# Patient Record
Sex: Male | Born: 1970 | Race: White | Hispanic: No | Marital: Single | Smoking: Current every day smoker
Health system: Southern US, Community
[De-identification: ages and names within clinical notes are randomized; demographics above are authoritative.]

## PROBLEM LIST (undated history)

## (undated) DIAGNOSIS — E119 Type 2 diabetes mellitus without complications: Secondary | ICD-10-CM

## (undated) DIAGNOSIS — S37019A Minor contusion of unspecified kidney, initial encounter: Secondary | ICD-10-CM

## (undated) DIAGNOSIS — N200 Calculus of kidney: Secondary | ICD-10-CM

---

## 2004-04-05 ENCOUNTER — Emergency Department: Payer: Self-pay | Admitting: Emergency Medicine

## 2005-07-30 ENCOUNTER — Observation Stay (HOSPITAL_COMMUNITY): Admission: EM | Admit: 2005-07-30 | Discharge: 2005-07-31 | Payer: Self-pay | Admitting: Emergency Medicine

## 2005-07-30 ENCOUNTER — Ambulatory Visit: Payer: Self-pay | Admitting: Cardiology

## 2006-08-06 ENCOUNTER — Ambulatory Visit (HOSPITAL_COMMUNITY): Admission: RE | Admit: 2006-08-06 | Discharge: 2006-08-06 | Payer: Self-pay | Admitting: Family Medicine

## 2007-02-27 ENCOUNTER — Ambulatory Visit (HOSPITAL_COMMUNITY): Admission: RE | Admit: 2007-02-27 | Discharge: 2007-02-27 | Payer: Self-pay | Admitting: Internal Medicine

## 2010-05-26 NOTE — Consult Note (Signed)
NAME:  Allen Mooney, Allen Mooney                  ACCOUNT NO.:  [id number]   MEDICAL RECORD NO.:  [id number]          PATIENT TYPE:  INP   LOCATION:  A227                          FACILITY:  [facility]   PHYSICIAN:  Weinert Bing, M.D. [facility] OF BIRTH:  08-Aug-1970   DATE OF CONSULTATION:  DATE OF DISCHARGE:                                   CONSULTATION   REFERRING PHYSICIAN:  Hanley Hays. Josefine Class, M.D.   PRIMARY CARE PHYSICIAN:  [facility] Department.   HISTORY OF PRESENT ILLNESS:  A 40 year old gentleman with no prior cardiac  history presents with chest discomfort.  Allen Mooney has enjoyed generally good  health.  He has never been admitted to hospital.  He was found to have  diabetes a few years ago and has received intermittent and minimal on a  treatment since that time.  He has had a number emergency department visits  for dizziness and malaise attributed to hyperglycemia.  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  There was associated  diaphoresis, nausea and dyspnea.  EMS was summoned and administered 2  sublingual nitroglycerin with improvement.  After additional nitroglycerin  in the emergency department, symptoms gradually subsided.  The patient has  felt fine since.   Cardiovascular risk factors include a 15-pack-year history of cigarette  smoking, which continues at the rate of one pack per day.  There is no  history of hypertension.  Cholesterol has been checked in the past and has  been okay.  There is no family history for coronary disease.   PAST MEDICAL HISTORY:  Is otherwise benign.  He has never undergone any  surgical procedures.  He has never previously been evaluated by cardiologist  nor required any significant cardiac testing.   SOCIAL HISTORY:  Single, but lives with a girlfriend who has two teenage  children.  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  No excessive use of alcohol.  Denies other drugs.   He has no known medical allergies.   MEDICATIONS AT HOME:  Metformin once a day.   FAMILY HISTORY:  Multiple members with diabetes; no coronary disease.   REVIEW OF SYSTEMS:  Chronic and recurrent headaches, some characterized as  migraines.  He has sleep disturbance, both with difficulty falling asleep  and multiple middle of the night awakenings.  He claims to sleep only 1-2  hours per night.  This results in daytime fatigue and somnolence, but he  does not fall asleep spontaneously during the day.  He reports no history of  GI illnesses.  He has had no lung conditions.  There is no chronic cough nor  sputum production.  He has not had multiple episodes of bronchitis.  He has  never suffered a severe injury nor required the services of an orthopedic  surgeon.  All other systems reviewed and are negative.   PHYSICAL EXAMINATION:  GENERAL:  A pleasant young gentleman in no acute  distress.  VITAL SIGNS:  The blood pressure is 120/80, heart rate 80 and regular,  respirations 16.  NECK:  No jugular venous distension; normal carotid upstrokes without  bruits.  HEENT:  Pupils equal, round, react to light; EOMs full; anicteric sclerae.  SKIN:  No significant abnormalities.  ENDOCRINE:  No thyromegaly.  HEMATOPOIETIC:  No adenopathy.  LUNGS:  Clear.  CARDIAC:  Normal first and second heart sounds.  ABDOMEN:  Soft and nontender; no masses; no organomegaly; no bruits.  EXTREMITIES:  No edema; normal distal pulse pulses.  NEUROMUSCULAR:  Symmetric strength and tone; normal cranial nerves.  MUSCULOSKELETAL:  No joint deformities.Marland Kitchen  EXTREMITIES:  Few minor varicose veins.   Initial laboratory notable for normal chemistry profile except for glucose  of 357, normal point-of-care markers, normal CBC.  A drug screen was  negative.   Chest x-ray is reportedly negative.   EKG shows normal sinus rhythm and  is within normal limits.   IMPRESSION:  Allen Mooney is a young gentleman who has a relatively low risk of  coronary disease despite the presence of diabetes for approximately 5 years.  Nonetheless, his episode of chest discomfort was consistent with myocardial  ischemia based upon the quality of discomfort and associated symptoms.  Reassuring is a normal exam, normal EKG and normal cardiac markers.  We will  proceed with a standard stress test in the morning if markers and EKGs  remain normal.  Other diagnostic considerations include pulmonary embolism  (a D-dimer level will be obtained), a paroxysmal arrhythmia or discomfort of  GI origin.  We greatly appreciate the request for consultation and will be  happy to follow this nice gentleman with you.      [place] Bing, M.D. [facility]  Electronically Signed     RR/MEDQ  D:  07/30/2005  T:  07/30/2005  Job:  [id number]

## 2010-05-26 NOTE — Procedures (Signed)
NAME:  Allen Mooney, Allen Mooney                  ACCOUNT NO.:  [id number]   MEDICAL RECORD NO.:  [id number]          PATIENT TYPE:  INP   LOCATION:  A227                          FACILITY:  [facility]   PHYSICIAN:  Vida Roller, M.D.   DATE OF BIRTH:  14-Feb-1970   DATE OF PROCEDURE:  07/31/2005  DATE OF DISCHARGE:                                  ECHOCARDIOGRAM   PRIMARY:  Dr. Othelia Pulling NUMBER:  [id number]   TAPE COUNT:  [id number]   A 40 year old man with chest pain.  Technical quality of the study is  adequate.   M-MODE TRACINGS:  The aorta is 34 mm.   Left atrium is 34 mm.   Septum is 10 mm.   Posterior wall is 10 mm.   Left ventricular diastolic dimension is 45 mm.   Left ventricular systolic dimension 32 mm.   2-D AND DOPPLER IMAGING:  The left ventricle is normal size.  There is  preserved LV systolic function.  Estimated ejection fraction 50-55%.  No  wall motion abnormalities are seen.   Right ventricle is top normal in size with normal systolic function.   The atria are both normal size.   There is no significant valvular heart disease.   There is no pericardial effusion.   The IVC is normal size.      Vida Roller, M.D.  Electronically Signed     JH/MEDQ  D:  07/31/2005  T:  07/31/2005  Job:  [id number]   cc:   Hanley Hays. Josefine Class, M.D.  Fax: [phone]

## 2010-05-26 NOTE — H&P (Signed)
NAME:  Allen Mooney, Allen Mooney                  ACCOUNT NO.:  [id number]   MEDICAL RECORD NO.:  [id number]          PATIENT TYPE:  INP   LOCATION:  A227                          FACILITY:  [facility]   PHYSICIAN:  Hanley Hays. 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  He apparently sat down and EMS was  summoned.  They provided oxygen and nitroglycerin spray x2 with some  diminution of the pain.  By the time he reached the emergency room he was  still having pain received two sublingual nitroglycerin with complete  resolution.  The patient denies any changes in exercise tolerance.  Denies  any PND or dyspnea on exertion.  He does note he has been feeling weak and  tired, more so recently.  He has poor sleep with polyuria and polydipsia and  just doesn't sleep well and never has.  He was on Avandia and Glucophage  for a period of time, but he never followed up completely.  He has a  Glucometer but does not check his glucoses.  He has had no previous history  of chest pain though he did have a slight episode about a week prior but  presented to the emergency room complaining at [facility] complaining of  weakness.   SOCIAL HISTORY:  He smokes two to three packs per day.  He lives with his  girlfriend and her two teenage children, for whom he is responsible.  He  works as a Psychologist, occupational.  He denies any alcohol use.  He denies any illicit  drug use.   FAMILY MEDICAL HISTORY:  Pertinent for diabetes in his mother, father and  two sisters.  He has a twin brother who does not have diabetes.  There is no  premature coronary artery disease.   Lipid status is unknown.   REVIEW OF SYSTEMS:  Also pertinent in that the patient complains of  intermittent right lower extremity pain in the anterior shin after standing  for a prolonged period time.  He states it aches in his knee but he has a  history of knee injury as a child.  No swelling, no neuropathic-type  symptoms otherwise.  He is active.  He has had no weight loss, steady at  180.  No cough, shortness of breath, etc.  Denies any GU complaints.  Positive constipation as of late.   PHYSICAL EXAMINATION:  GENERAL:  A well-developed, well-nourished gentleman  in no distress.  VITAL SIGNS:  Blood pressure 140/63, pulse is 80 and regular, respirations  are unlabored at rest.  HEENT:  His oropharynx is dry, teeth are in fair repair.  NECK:  Supple.  There is no JVD, adenopathy or thyromegaly.  LUNGS:  Diminished throughout but  clear.  CARDIAC:  His heart is regular rate and rhythm.  Could not appreciate a  murmur, gallop or rub.  ABDOMEN:  Soft, nontender, without bruits or mass.  EXTREMITIES:  Without clubbing, cyanosis or edema.  He has good dorsalis  pedis pulses bilaterally.  He has multiple spider veins on his lower  extremities and some varicosities but no edema.  He has some mycotic changes  at this time.  NEUROLOGIC:  Reveals a gentleman to be alert and appropriate.  Pupils equal,  round and reactive to light.  Nonfocal neuro exam.   ASSESSMENT/PLAN:  1.  Chest pain relieved with sublingual nitroglycerin in a patient with      uncontrolled diabetes mellitus and multiple risk factors.  2.  Diabetes mellitus, uncontrolled.  3.  Tobacco abuse.  4.  Insomnia, multifactorial.  May deserve further workup.  5.  Right lower extremity pain, unclear etiology at this time.   1.  The patient is admitted to the telemetry service with echocardiogram,      serial enzymes and monitoring, [place] cardiology consultation.  2.  Diabetes mellitus:  Begin sliding scale insulin.  Check  hemoglobin A1c      and liver profile.  Proceed with Glucophage and further evaluation as      indicated.  3.  Tobacco abuse.  The patient will be treated with transdermal nicotine      and smoking cessation counseling.   The plan is discussed with the patient and his significant other, who has  limited insight but reasonable understanding.      Hanley Hays Josefine Class, M.D.  Electronically Signed     FED/MEDQ  D:  07/30/2005  T:  07/30/2005  Job:  [id number]

## 2010-05-26 NOTE — Discharge Summary (Signed)
NAME:  Allen Mooney, Allen Mooney                  ACCOUNT NO.:  [id number]   MEDICAL RECORD NO.:  [id number]          PATIENT TYPE:  INP   LOCATION:  A227                          FACILITY:  [facility]   PHYSICIAN:  Osvaldo Shipper, MD     DATE OF BIRTH:  11/08/1970   DATE OF ADMISSION:  07/30/2005  DATE OF DISCHARGE:  07/23/2007LH                                 DISCHARGE SUMMARY   HISTORY:  The patient does not have a primary medical doctor.  However, he  is interested in seeing Dr. Phillips Odor and he is going to make that  appointment.   DISCHARGE DIAGNOSES:  1.  Chest pain, resolved, likely noncardiac.  2.  Type 2 diabetes, noncompliant.  3.  Tobacco abuse.   Please review H&P dictated at the time of admission for details regarding  the patient's presenting illness.   BRIEF HOSPITAL COURSE:  1.  Briefly, this is a 40 year old Caucasian male with a history of type 2      diabetes who is not very compliant with his medications who presented to      the ED with complaints of chest pain.  The chest pain, apparently,      resolved with sublingual nitroglycerin.  Because of his history of      diabetes, the patient was seen by [facility].  The patient      underwent an echocardiogram which has been reported as being      unremarkable including a normal EF and no wall motion abnormalities.      The patient subsequently underwent, also, an exercise treadmill test      without Myoview with which he went up to maximum mets and did not have      any chest pain and no EKG changes were appreciated.  Hence, the patient      was cleared by cardiology for discharge.  The most likely etiology for      his chest pain was thought to be noncardiac including musculoskeletal.      The patient states he also works in a work environment with a lot of      fumes and the patient possibly could have inhaled some of the smoke and      could have experienced some chest tightness.  2.  The patient has type 2 diabetes.   Unfortunately, he is not compliant      with his medications.  I strongly counseled the patient to take his      medications regularly and as prescribed.  The patient seems to      understand this.  Nutrition consult was also ordered before discharge.  3.  Tobacco abuse.  Again, strong counseling was done.  He was prescribed      nicotine patches as he seemed interested in these.   DISCHARGE MEDICATIONS:  1.  Glucophage 500 mg b.i.d.  2.  He was told to continue his Avandia that he was taking at home.  3.  Aspirin 81 mg daily.  4.  Nicotine patch 21 mg topically daily.   FOLLOWUP:  Follow up with Dr. Phillips Odor or any other P.M.D. within a week if  possible.   Pending studies include HGB A1c and TSH level.   PROCEDURES PERFORMED:  1.  2-D echocardiogram.  2.  Exercise treadmill stress test.  3.  Chest x-ray which showed mild bibasilar atelectasis.  Otherwise, no      other acute abnormalities were noted.   DIET:  The patient was asked to have a low-fat diet.   PHYSICAL ACTIVITY:  As before.   ADDENDUM:  HBA1C: 10.2  TSH: 0.706      Osvaldo Shipper, MD  Electronically Signed     GK/MEDQ  D:  07/31/2005  T:  07/31/2005  Job:  [id number]   cc:   Corrie Mckusick, M.D.  Fax: [phone]

## 2013-10-27 ENCOUNTER — Emergency Department (HOSPITAL_COMMUNITY)
Admission: EM | Admit: 2013-10-27 | Discharge: 2013-10-27 | Disposition: A | Discharge status: Home / Self Care | Payer: Managed Care, Other (non HMO) | Attending: Emergency Medicine | Admitting: Emergency Medicine

## 2013-10-27 ENCOUNTER — Encounter (HOSPITAL_COMMUNITY): Payer: Self-pay | Admitting: Emergency Medicine

## 2013-10-27 DIAGNOSIS — Z720 Tobacco use: Secondary | ICD-10-CM | POA: Insufficient documentation

## 2013-10-27 DIAGNOSIS — B86 Scabies: Secondary | ICD-10-CM | POA: Diagnosis not present

## 2013-10-27 DIAGNOSIS — E119 Type 2 diabetes mellitus without complications: Secondary | ICD-10-CM | POA: Insufficient documentation

## 2013-10-27 DIAGNOSIS — R21 Rash and other nonspecific skin eruption: Secondary | ICD-10-CM | POA: Diagnosis present

## 2013-10-27 HISTORY — DX: Type 2 diabetes mellitus without complications: E11.9

## 2013-10-27 MED ORDER — PERMETHRIN 5 % EX CREA: TOPICAL_CREAM | CUTANEOUS | Status: DC

## 2013-10-27 NOTE — ED Notes (Signed)
Rash on groin area for a week, exposed to scabies

## 2013-10-27 NOTE — ED Notes (Signed)
Pt with itching and rash for a week, recently exposed to someone with dx of scabies

## 2013-10-27 NOTE — Discharge Instructions (Signed)

## 2013-10-27 NOTE — ED Provider Notes (Signed)
CSN: [id number]     Arrival date & time 10/27/13  1125 History  This chart was scribed for non-physician practitioner Tommy RainwaterShari A Abhijay Morriss PA-C, working with Joya Gaskinsonald W Wickline, MD by Leone PayorSonum Patel, ED Scribe. This patient was seen in room APFT20/APFT20 and the patient's care was started at 12:06 PM.    Chief Complaint  Patient presents with  . Rash    The history is provided by the patient. No language interpreter was used.    HPI Comments: Allen Mooney is a 43 y.o. male who presents to the Emergency Department complaining of 7 days of gradual onset, gradually worsening, constant rash to the bilateral inner thighs and abdomen. He has associated itching and has exposure to someone with known scabies. He has not tried any home remedies for his symptoms. He denies fever.   Past Medical History  Diagnosis Date  . Diabetes mellitus without complication    History reviewed. No pertinent past surgical history. No family history on file. History  Substance Use Topics  . Smoking status: Current Every Day Smoker -- 1.00 packs/day  . Smokeless tobacco: Not on file  . Alcohol Use: No    Review of Systems  Constitutional: Negative for fever.  Skin: Positive for rash.      Allergies  Review of patient's allergies indicates not on file.  Home Medications   Prior to Admission medications   Medication Sig Start Date End Date Taking? Authorizing Provider  permethrin (ELIMITE) 5 % cream Apply from neck down at night and wash off in the morning 10/27/13   Jaire Pinkham A Latrica Clowers, PA-C   BP 139/99  Pulse 104  Temp(Src) 98.1 F (36.7 C)  Resp 18  Ht 5\' 11"  (1.803 m)  Wt 145 lb (65.772 kg)  BMI 20.23 kg/m2  SpO2 100% Physical Exam  Nursing note and vitals reviewed. Constitutional: He is oriented to person, place, and time. He appears well-developed and well-nourished.  HENT:  Head: Normocephalic and atraumatic.  Cardiovascular: Normal rate.   Pulmonary/Chest: Effort normal.  Neurological: He is  alert and oriented to person, place, and time.  Skin: Skin is warm and dry. Rash noted. Rash is maculopapular (beltline of the abdomen).  Psychiatric: He has a normal mood and affect.    ED Course  Procedures (including critical care time)  DIAGNOSTIC STUDIES: Oxygen Saturation is 100% on RA, normal by my interpretation.    COORDINATION OF CARE: 12:08 PM Will discharge home with permethrin cream. Discussed treatment plan with pt at bedside and pt agreed to plan.   Labs Review Labs Reviewed - No data to display  Imaging Review No results found.   EKG Interpretation None      MDM   Final diagnoses:  Scabies    1. Scabies  Characteristic rash of scabies with known home exposure with family member. Will treat with permethrin and in home care instruction.  I personally performed the services described in this documentation, which was scribed in my presence. The recorded information has been reviewed and is accurate.     Arnoldo HookerShari A Haygen Zebrowski, PA-C 10/27/13 1513

## 2013-10-28 NOTE — ED Provider Notes (Signed)
Medical screening examination/treatment/procedure(s) were performed by non-physician practitioner and as supervising physician I was immediately available for consultation/collaboration.   EKG Interpretation None        Joya Gaskinsonald W Owenn Rothermel, MD 10/28/13 [id number]

## 2013-11-09 ENCOUNTER — Emergency Department (HOSPITAL_COMMUNITY)
Admission: EM | Admit: 2013-11-09 | Discharge: 2013-11-09 | Discharge status: Against Medical Advice | Payer: Managed Care, Other (non HMO) | Attending: Emergency Medicine | Admitting: Emergency Medicine

## 2013-11-09 ENCOUNTER — Encounter (HOSPITAL_COMMUNITY): Payer: Self-pay | Admitting: *Deleted

## 2013-11-09 DIAGNOSIS — E119 Type 2 diabetes mellitus without complications: Secondary | ICD-10-CM | POA: Insufficient documentation

## 2013-11-09 DIAGNOSIS — L97909 Non-pressure chronic ulcer of unspecified part of unspecified lower leg with unspecified severity: Secondary | ICD-10-CM | POA: Diagnosis not present

## 2013-11-09 DIAGNOSIS — Z720 Tobacco use: Secondary | ICD-10-CM | POA: Insufficient documentation

## 2013-11-09 DIAGNOSIS — L97509 Non-pressure chronic ulcer of other part of unspecified foot with unspecified severity: Secondary | ICD-10-CM

## 2013-11-09 LAB — BASIC METABOLIC PANEL
Anion gap: 10 (ref 5–15)
BUN: 11 mg/dL (ref 6–23)
CO2: 28 mEq/L (ref 19–32)
Calcium: 9 mg/dL (ref 8.4–10.5)
Chloride: 98 mEq/L (ref 96–112)
Creatinine, Ser: 0.79 mg/dL (ref 0.50–1.35)
GFR calc Af Amer: >90 mL/min (ref >90)
Glucose, Bld: 476 mg/dL — ABNORMAL HIGH (ref 70–99)
Potassium: 4.7 mEq/L (ref 3.7–5.3)
Sodium: 136 mEq/L — ABNORMAL LOW (ref 137–147)

## 2013-11-09 LAB — CBC WITH DIFFERENTIAL/PLATELET
Basophils Absolute: 0.1 10*3/uL (ref 0.0–0.1)
Basophils Relative: 1 % (ref 0–1)
EOS PCT: 3 % (ref 0–5)
Eosinophils Absolute: 0.3 10*3/uL (ref 0.0–0.7)
HCT: 46.4 % (ref 39.0–52.0)
HEMOGLOBIN: 16.1 g/dL (ref 13.0–17.0)
LYMPHS ABS: 2.8 10*3/uL (ref 0.7–4.0)
Lymphocytes Relative: 29 % (ref 12–46)
MCH: 32 pg (ref 26.0–34.0)
MCHC: 34.7 g/dL (ref 30.0–36.0)
MCV: 92.2 fL (ref 78.0–100.0)
MONOS PCT: 5 % (ref 3–12)
Monocytes Absolute: 0.5 10*3/uL (ref 0.1–1.0)
Neutro Abs: 6 10*3/uL (ref 1.7–7.7)
Neutrophils Relative %: 62 % (ref 43–77)
PLATELETS: 263 10*3/uL (ref 150–400)
RBC: 5.03 MIL/uL (ref 4.22–5.81)
RDW: 12.5 % (ref 11.5–15.5)
WBC: 9.6 10*3/uL (ref 4.0–10.5)

## 2013-11-09 NOTE — ED Notes (Signed)
Called patient from waiting room at 1910 and [phone] with no response. 

## 2013-11-09 NOTE — ED Notes (Signed)
Pt has  Foot ulcer on rt foot. For 6 weeks.

## 2014-02-11 ENCOUNTER — Encounter (HOSPITAL_COMMUNITY): Payer: Self-pay | Admitting: *Deleted

## 2014-02-11 ENCOUNTER — Emergency Department (HOSPITAL_COMMUNITY): Payer: Managed Care, Other (non HMO)

## 2014-02-11 ENCOUNTER — Emergency Department (HOSPITAL_COMMUNITY)
Admission: EM | Admit: 2014-02-11 | Discharge: 2014-02-11 | Disposition: A | Discharge status: Home / Self Care | Payer: Managed Care, Other (non HMO) | Attending: Emergency Medicine | Admitting: Emergency Medicine

## 2014-02-11 DIAGNOSIS — L97419 Non-pressure chronic ulcer of right heel and midfoot with unspecified severity: Secondary | ICD-10-CM | POA: Insufficient documentation

## 2014-02-11 DIAGNOSIS — Z4801 Encounter for change or removal of surgical wound dressing: Secondary | ICD-10-CM | POA: Diagnosis present

## 2014-02-11 DIAGNOSIS — Z79899 Other long term (current) drug therapy: Secondary | ICD-10-CM | POA: Diagnosis not present

## 2014-02-11 DIAGNOSIS — E11621 Type 2 diabetes mellitus with foot ulcer: Secondary | ICD-10-CM | POA: Insufficient documentation

## 2014-02-11 DIAGNOSIS — Z720 Tobacco use: Secondary | ICD-10-CM | POA: Insufficient documentation

## 2014-02-11 DIAGNOSIS — L97509 Non-pressure chronic ulcer of other part of unspecified foot with unspecified severity: Secondary | ICD-10-CM

## 2014-02-11 DIAGNOSIS — E1165 Type 2 diabetes mellitus with hyperglycemia: Secondary | ICD-10-CM | POA: Diagnosis not present

## 2014-02-11 LAB — CBC WITH DIFFERENTIAL/PLATELET
Basophils Absolute: 0.1 10*3/uL (ref 0.0–0.1)
Basophils Relative: 1 % (ref 0–1)
Eosinophils Absolute: 0.3 10*3/uL (ref 0.0–0.7)
Eosinophils Relative: 2 % (ref 0–5)
HCT: 48.2 % (ref 39.0–52.0)
Hemoglobin: 16.8 g/dL (ref 13.0–17.0)
Lymphocytes Relative: 21 % (ref 12–46)
Lymphs Abs: 2.2 10*3/uL (ref 0.7–4.0)
MCH: 32.6 pg (ref 26.0–34.0)
MCHC: 34.9 g/dL (ref 30.0–36.0)
MCV: 93.4 fL (ref 78.0–100.0)
Monocytes Absolute: 0.6 10*3/uL (ref 0.1–1.0)
Monocytes Relative: 5 % (ref 3–12)
NEUTROS PCT: 71 % (ref 43–77)
Neutro Abs: 7.4 10*3/uL (ref 1.7–7.7)
Platelets: 239 10*3/uL (ref 150–400)
RBC: 5.16 MIL/uL (ref 4.22–5.81)
RDW: 12.5 % (ref 11.5–15.5)
WBC: 10.6 10*3/uL — ABNORMAL HIGH (ref 4.0–10.5)

## 2014-02-11 LAB — BASIC METABOLIC PANEL
ANION GAP: 4 — AB (ref 5–15)
BUN: 19 mg/dL (ref 6–23)
CHLORIDE: 100 mmol/L (ref 96–112)
CO2: 29 mmol/L (ref 19–32)
Calcium: 9.4 mg/dL (ref 8.4–10.5)
Creatinine, Ser: 0.87 mg/dL (ref 0.50–1.35)
GFR calc non Af Amer: >90 mL/min (ref >90)
Glucose, Bld: 541 mg/dL — ABNORMAL HIGH (ref 70–99)
Potassium: 5.5 mmol/L — ABNORMAL HIGH (ref 3.5–5.1)
SODIUM: 133 mmol/L — AB (ref 135–145)

## 2014-02-11 LAB — CBG MONITORING, ED: GLUCOSE-CAPILLARY: 259 mg/dL — AB (ref 70–99)

## 2014-02-11 LAB — POTASSIUM: Potassium: 4.6 mmol/L (ref 3.5–5.1)

## 2014-02-11 MED ORDER — CIPROFLOXACIN HCL 250 MG PO TABS
500.0000 mg | ORAL_TABLET | Freq: Once | ORAL | Status: AC
  Administered 2014-02-11: 500 mg via ORAL
  Filled 2014-02-11: qty 2

## 2014-02-11 MED ORDER — HYDROCODONE-ACETAMINOPHEN 5-325 MG PO TABS: 1.0000 | ORAL_TABLET | ORAL | Status: DC | PRN

## 2014-02-11 MED ORDER — SODIUM CHLORIDE 0.9 % IV BOLUS (SEPSIS)
1000.0000 mL | Freq: Once | INTRAVENOUS | Status: AC
  Administered 2014-02-11: 1000 mL via INTRAVENOUS

## 2014-02-11 MED ORDER — SULFAMETHOXAZOLE-TRIMETHOPRIM 800-160 MG PO TABS
1.0000 | ORAL_TABLET | Freq: Once | ORAL | Status: AC
  Administered 2014-02-11: 1 via ORAL
  Filled 2014-02-11: qty 1

## 2014-02-11 MED ORDER — HYDROCODONE-ACETAMINOPHEN 5-325 MG PO TABS
1.0000 | ORAL_TABLET | Freq: Once | ORAL | Status: AC
  Administered 2014-02-11: 1 via ORAL
  Filled 2014-02-11: qty 1

## 2014-02-11 MED ORDER — CIPROFLOXACIN HCL 500 MG PO TABS: 500.0000 mg | ORAL_TABLET | Freq: Two times a day (BID) | ORAL | Status: DC

## 2014-02-11 MED ORDER — SULFAMETHOXAZOLE-TRIMETHOPRIM 800-160 MG PO TABS: 1.0000 | ORAL_TABLET | Freq: Two times a day (BID) | ORAL | Status: DC

## 2014-02-11 MED ORDER — GADOBENATE DIMEGLUMINE 529 MG/ML IV SOLN
14.0000 mL | Freq: Once | INTRAVENOUS | Status: AC | PRN
  Administered 2014-02-11: 14 mL via INTRAVENOUS

## 2014-02-11 NOTE — Discharge Instructions (Signed)
Diabetes and Foot Care Diabetes may cause you to have problems because of poor blood supply (circulation) to your feet and legs. This may cause the skin on your feet to become thinner, break easier, and heal more slowly. Your skin may become dry, and the skin may peel and crack. You may also have nerve damage in your legs and feet causing decreased feeling in them. You may not notice minor injuries to your feet that could lead to infections or more serious problems. Taking care of your feet is one of the most important things you can do for yourself.  HOME CARE INSTRUCTIONS  Wear shoes at all times, even in the house. Do not go barefoot. Bare feet are easily injured.  Check your feet daily for blisters, cuts, and redness. If you cannot see the bottom of your feet, use a mirror or ask someone for help.  Wash your feet with warm water (do not use hot water) and mild soap. Then pat your feet and the areas between your toes until they are completely dry. Do not soak your feet as this can dry your skin.  Apply a moisturizing lotion or petroleum jelly (that does not contain alcohol and is unscented) to the skin on your feet and to dry, brittle toenails. Do not apply lotion between your toes.  Trim your toenails straight across. Do not dig under them or around the cuticle. File the edges of your nails with an emery board or nail file.  Do not cut corns or calluses or try to remove them with medicine.  Wear clean socks or stockings every day. Make sure they are not too tight. Do not wear knee-high stockings since they may decrease blood flow to your legs.  Wear shoes that fit properly and have enough cushioning. To break in new shoes, wear them for just a few hours a day. This prevents you from injuring your feet. Always look in your shoes before you put them on to be sure there are no objects inside.  Do not cross your legs. This may decrease the blood flow to your feet.  If you find a minor scrape,  cut, or break in the skin on your feet, keep it and the skin around it clean and dry. These areas may be cleansed with mild soap and water. Do not cleanse the area with peroxide, alcohol, or iodine.  When you remove an adhesive bandage, be sure not to damage the skin around it.  If you have a wound, look at it several times a day to make sure it is healing.  Do not use heating pads or hot water bottles. They may burn your skin. If you have lost feeling in your feet or legs, you may not know it is happening until it is too late.  Make sure your health care provider performs a complete foot exam at least annually or more often if you have foot problems. Report any cuts, sores, or bruises to your health care provider immediately. SEEK MEDICAL CARE IF:   You have an injury that is not healing.  You have cuts or breaks in the skin.  You have an ingrown nail.  You notice redness on your legs or feet.  You feel burning or tingling in your legs or feet.  You have pain or cramps in your legs and feet.  Your legs or feet are numb.  Your feet always feel cold. SEEK IMMEDIATE MEDICAL CARE IF:   There is increasing redness,  swelling, or pain in or around a wound.  There is a red line that goes up your leg.  Pus is coming from a wound.  You develop a fever or as directed by your health care provider.  You notice a bad smell coming from an ulcer or wound. Document Released: 12/23/1999 Document Revised: 08/27/2012 Document Reviewed: 06/03/2012 [facility] Patient Information 2015 [place], [place]. This information is not intended to replace advice given to you by your health care provider. Make sure you discuss any questions you have with your health care provider.  Blood Glucose Monitoring Monitoring your blood glucose (also know as blood sugar) helps you to manage your diabetes. It also helps you and your health care provider monitor your diabetes and determine how well your treatment plan is  working. WHY SHOULD YOU MONITOR YOUR BLOOD GLUCOSE?  It can help you understand how food, exercise, and medicine affect your blood glucose.  It allows you to know what your blood glucose is at any given moment. You can quickly tell if you are having low blood glucose (hypoglycemia) or high blood glucose (hyperglycemia).  It can help you and your health care provider know how to adjust your medicines.  It can help you understand how to manage an illness or adjust medicine for exercise. WHEN SHOULD YOU TEST? Your health care provider will help you decide how often you should check your blood glucose. This may depend on the type of diabetes you have, your diabetes control, or the types of medicines you are taking. Be sure to write down all of your blood glucose readings so that this information can be reviewed with your health care provider. See below for examples of testing times that your health care provider may suggest. Type 1 Diabetes  Test 4 times a day if you are in good control, using an insulin pump, or perform multiple daily injections.  If your diabetes is not well controlled or if you are sick, you may need to monitor more often.  It is a good idea to also monitor:  Before and after exercise.  Between meals and 2 hours after a meal.  Occasionally between 2:00 a.m. and 3:00 a.m. Type 2 Diabetes  It can vary with each person, but generally, if you are on insulin, test 4 times a day.  If you take medicines by mouth (orally), test 2 times a day.  If you are on a controlled diet, test once a day.  If your diabetes is not well controlled or if you are sick, you may need to monitor more often. HOW TO MONITOR YOUR BLOOD GLUCOSE Supplies Needed  Blood glucose meter.  Test strips for your meter. Each meter has its own strips. You must use the strips that go with your own meter.  A pricking needle (lancet).  A device that holds the lancet (lancing device).  A journal or log  book to write down your results. Procedure  Wash your hands with soap and water. Alcohol is not preferred.  Prick the side of your finger (not the tip) with the lancet.  Gently milk the finger until a small drop of blood appears.  Follow the instructions that come with your meter for inserting the test strip, applying blood to the strip, and using your blood glucose meter. Other Areas to Get Blood for Testing Some meters allow you to use other areas of your body (other than your finger) to test your blood. These areas are called alternative sites. The most  common alternative sites are:  The forearm.  The thigh.  The back area of the lower leg.  The palm of the hand. The blood flow in these areas is slower. Therefore, the blood glucose values you get may be delayed, and the numbers are different from what you would get from your fingers. Do not use alternative sites if you think you are having hypoglycemia. Your reading will not be accurate. Always use a finger if you are having hypoglycemia. Also, if you cannot feel your lows (hypoglycemia unawareness), always use your fingers for your blood glucose checks. ADDITIONAL TIPS FOR GLUCOSE MONITORING  Do not reuse lancets.  Always carry your supplies with you.  All blood glucose meters have a 24-hour "hotline" number to call if you have questions or need help.  Adjust (calibrate) your blood glucose meter with a control solution after finishing a few boxes of strips. BLOOD GLUCOSE RECORD KEEPING It is a good idea to keep a daily record or log of your blood glucose readings. Most glucose meters, if not all, keep your glucose records stored in the meter. Some meters come with the ability to download your records to your home computer. Keeping a record of your blood glucose readings is especially helpful if you are wanting to look for patterns. Make notes to go along with the blood glucose readings because you might forget what happened at that  exact time. Keeping good records helps you and your health care provider to work together to achieve good diabetes management.  Document Released: 12/28/2002 Document Revised: 05/11/2013 Document Reviewed: 05/19/2012 [facility] Patient Information 2015 [place], [place]. This information is not intended to replace advice given to you by your health care provider. Make sure you discuss any questions you have with your health care provider.   Keep your foot clean and dry and bandaged.  Complete your entire course of antibiotics.  You may take the hydro prescribed for pain relief.  This will make you drowsy - do not drive within 4 hours of taking this medication.  Call Dr. Phillips OdorGolding to re-establish medical care with him for further treatment of this diabetic foot ulcer.  This infection can become much worse and risk loosing toes or foot if this is not cared for properly.  You have also been referred to our rehab facility for wound care and you should receive a phone call from them to schedule this.

## 2014-02-11 NOTE — ED Notes (Signed)
Open area to plantar surface of rt foot for 1 month. Pt is diabetic

## 2014-02-11 NOTE — ED Notes (Signed)
Pt back from MRI, fluids reconnected.
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
## 2015-09-14 ENCOUNTER — Emergency Department (HOSPITAL_COMMUNITY)
Admission: EM | Admit: 2015-09-14 | Discharge: 2015-09-14 | Disposition: A | Discharge status: Home / Self Care | Payer: BLUE CROSS/BLUE SHIELD | Attending: Emergency Medicine | Admitting: Emergency Medicine

## 2015-09-14 ENCOUNTER — Encounter (HOSPITAL_COMMUNITY): Payer: Self-pay | Admitting: *Deleted

## 2015-09-14 ENCOUNTER — Emergency Department (HOSPITAL_COMMUNITY): Payer: BLUE CROSS/BLUE SHIELD

## 2015-09-14 DIAGNOSIS — E1165 Type 2 diabetes mellitus with hyperglycemia: Secondary | ICD-10-CM | POA: Insufficient documentation

## 2015-09-14 DIAGNOSIS — N201 Calculus of ureter: Secondary | ICD-10-CM

## 2015-09-14 DIAGNOSIS — N202 Calculus of kidney with calculus of ureter: Secondary | ICD-10-CM | POA: Diagnosis not present

## 2015-09-14 DIAGNOSIS — F17210 Nicotine dependence, cigarettes, uncomplicated: Secondary | ICD-10-CM | POA: Diagnosis not present

## 2015-09-14 DIAGNOSIS — R739 Hyperglycemia, unspecified: Secondary | ICD-10-CM

## 2015-09-14 DIAGNOSIS — R109 Unspecified abdominal pain: Secondary | ICD-10-CM | POA: Diagnosis present

## 2015-09-14 DIAGNOSIS — Z7984 Long term (current) use of oral hypoglycemic drugs: Secondary | ICD-10-CM | POA: Insufficient documentation

## 2015-09-14 DIAGNOSIS — N132 Hydronephrosis with renal and ureteral calculous obstruction: Secondary | ICD-10-CM | POA: Diagnosis not present

## 2015-09-14 DIAGNOSIS — N200 Calculus of kidney: Secondary | ICD-10-CM

## 2015-09-14 HISTORY — DX: Calculus of kidney: N20.0

## 2015-09-14 LAB — URINALYSIS, ROUTINE W REFLEX MICROSCOPIC
Bilirubin Urine: NEGATIVE
KETONES UR: NEGATIVE mg/dL
Nitrite: NEGATIVE
Specific Gravity, Urine: 1.02 (ref 1.005–1.030)
pH: 6 (ref 5.0–8.0)

## 2015-09-14 LAB — BASIC METABOLIC PANEL
ANION GAP: 10 (ref 5–15)
BUN: 14 mg/dL (ref 6–20)
CALCIUM: 8.7 mg/dL — AB (ref 8.9–10.3)
CO2: 28 mmol/L (ref 22–32)
Chloride: 98 mmol/L — ABNORMAL LOW (ref 101–111)
Creatinine, Ser: 0.96 mg/dL (ref 0.61–1.24)
Glucose, Bld: 407 mg/dL — ABNORMAL HIGH (ref 65–99)
Potassium: 4.3 mmol/L (ref 3.5–5.1)
SODIUM: 136 mmol/L (ref 135–145)

## 2015-09-14 LAB — CBC WITH DIFFERENTIAL/PLATELET
BASOS ABS: 0.1 10*3/uL (ref 0.0–0.1)
BASOS PCT: 1 %
EOS PCT: 2 %
Eosinophils Absolute: 0.3 10*3/uL (ref 0.0–0.7)
HCT: 45 % (ref 39.0–52.0)
Hemoglobin: 15.4 g/dL (ref 13.0–17.0)
Lymphocytes Relative: 14 %
Lymphs Abs: 2.2 10*3/uL (ref 0.7–4.0)
MCH: 32.4 pg (ref 26.0–34.0)
MCHC: 34.2 g/dL (ref 30.0–36.0)
MCV: 94.7 fL (ref 78.0–100.0)
MONO ABS: 1 10*3/uL (ref 0.1–1.0)
Monocytes Relative: 6 %
Neutro Abs: 11.7 10*3/uL — ABNORMAL HIGH (ref 1.7–7.7)
Neutrophils Relative %: 77 %
PLATELETS: 380 10*3/uL (ref 150–400)
RBC: 4.75 MIL/uL (ref 4.22–5.81)
RDW: 12.5 % (ref 11.5–15.5)
WBC: 15.2 10*3/uL — AB (ref 4.0–10.5)

## 2015-09-14 LAB — URINE MICROSCOPIC-ADD ON: SQUAMOUS EPITHELIAL / LPF: NONE SEEN

## 2015-09-14 LAB — CBG MONITORING, ED: Glucose-Capillary: 234 mg/dL — ABNORMAL HIGH (ref 65–99)

## 2015-09-14 MED ORDER — HYDROMORPHONE HCL 1 MG/ML IJ SOLN
1.0000 mg | Freq: Once | INTRAMUSCULAR | Status: AC
  Administered 2015-09-14: 1 mg via INTRAVENOUS
  Filled 2015-09-14: qty 1

## 2015-09-14 MED ORDER — KETOROLAC TROMETHAMINE 30 MG/ML IJ SOLN
INTRAMUSCULAR | Status: AC
  Filled 2015-09-14: qty 1

## 2015-09-14 MED ORDER — DEXTROSE 5 % IV SOLN
1.0000 g | Freq: Once | INTRAVENOUS | Status: AC
  Administered 2015-09-14: 1 g via INTRAVENOUS
  Filled 2015-09-14: qty 10

## 2015-09-14 MED ORDER — KETOROLAC TROMETHAMINE 30 MG/ML IJ SOLN
15.0000 mg | Freq: Once | INTRAMUSCULAR | Status: AC
  Administered 2015-09-14: 15 mg via INTRAVENOUS
  Filled 2015-09-14: qty 1

## 2015-09-14 MED ORDER — CIPROFLOXACIN HCL 500 MG PO TABS: 500.0000 mg | ORAL_TABLET | Freq: Two times a day (BID) | ORAL | 0 refills | Status: DC

## 2015-09-14 MED ORDER — ONDANSETRON HCL 4 MG/2ML IJ SOLN
4.0000 mg | Freq: Once | INTRAMUSCULAR | Status: AC
  Administered 2015-09-14: 4 mg via INTRAVENOUS
  Filled 2015-09-14: qty 2

## 2015-09-14 MED ORDER — IBUPROFEN 600 MG PO TABS: 600.0000 mg | ORAL_TABLET | Freq: Three times a day (TID) | ORAL | 0 refills | Status: DC | PRN

## 2015-09-14 MED ORDER — SODIUM CHLORIDE 0.9 % IV BOLUS (SEPSIS)
1000.0000 mL | Freq: Once | INTRAVENOUS | Status: AC
  Administered 2015-09-14: 1000 mL via INTRAVENOUS

## 2015-09-14 NOTE — ED Provider Notes (Signed)
[facility]-EMER[facility]ENCY DEPT Provider Note   CSN: [id number] Arrival date & time: 09/14/15  1138  By signing my name below, I, Placido Sou, attest that this documentation has been prepared under the direction and in the presence of Zadie Rhine, MD. Electronically Signed: Placido Sou, ED Scribe. 09/14/15. 1:43 PM.   History   Chief Complaint Chief Complaint  Patient presents with  . Flank Pain    HPI HPI Comments: Allen Mooney is a 45 y.o. male who presents to the Emergency Department complaining of waxing and waning, moderate, non-radiating, right flank pain which began on 09/03/2015. He reports associated dysuria, hematuria and intermittent n/v with worsening pain.  He confirms his listed medical history and notes a h/o kidney stones when he was 45 y/o. Pt denies having ever had surgery for kidney stones in the past and states he received pain medication until they pass. He denies groin pain, CP, SOB, weakness and numbness.    The history is provided by the patient. No language interpreter was used.  Flank Pain  This is a new problem. The current episode started more than 1 week ago. The problem occurs constantly. The problem has not changed since onset.Pertinent negatives include no chest pain and no shortness of breath.    Past Medical History:  Diagnosis Date  . Diabetes mellitus without complication (HCC)   . Kidney stones     There are no active problems to display for this patient.   History reviewed. No pertinent surgical history.   Home Medications    Prior to Admission medications   Medication Sig Start Date End Date Taking? Authorizing Provider  metFORMIN (GLUCOPHAGE) 500 MG tablet Take 500 mg by mouth 2 (two) times daily with a meal.   Yes Historical Provider, MD    Family History No family history on file.  Social History Social History  Substance Use Topics  . Smoking status: Current Every Day Smoker    Packs/day: 1.00    Types: Cigarettes  .  Smokeless tobacco: Never Used  . Alcohol use No     Allergies   Review of patient's allergies indicates no known allergies.   Review of Systems Review of Systems  Respiratory: Negative for shortness of breath.   Cardiovascular: Negative for chest pain.  Gastrointestinal: Positive for nausea and vomiting.  Genitourinary: Positive for dysuria, flank pain and hematuria. Negative for penile pain and testicular pain.  Neurological: Negative for weakness and numbness.  All other systems reviewed and are negative.    Physical Exam Updated Vital Signs BP 124/90 (BP Location: Left Arm)   Pulse 98   Temp 98.3 F (36.8 C) (Oral)   Resp 20   Ht 5\' 9"  (1.753 m)   Wt 149 lb 5 oz (67.7 kg)   SpO2 100%   BMI 22.05 kg/m   Physical Exam CONSTITUTIONAL: Well developed/well nourished HEAD: Normocephalic/atraumatic EYES: EOMI/PERRL ENMT: Mucous membranes moist NECK: supple no meningeal signs SPINE/BACK:entire spine nontender CV: S1/S2 noted, no murmurs/rubs/gallops noted LUNGS: Lungs are clear to auscultation bilaterally, no apparent distress ABDOMEN: soft, nontender, no rebound or guarding, bowel sounds noted throughout abdomen NW:GNFAO cva tenderness NEURO: Pt is awake/alert/appropriate, moves all extremitiesx4.  No facial droop.   EXTREMITIES: pulses normal/equal, full ROM SKIN: warm, color normal PSYCH: no abnormalities of mood noted, alert and oriented to situation   ED Treatments / Results  Labs (all labs ordered are listed, but only abnormal results are displayed) Labs Reviewed  URINALYSIS, ROUTINE W REFLEX MICROSCOPIC (NOT AT  [facility]) - Abnormal; Notable for the following:       Result Value   APPearance CLOUDY (*)    Glucose, UA >1000 (*)    Hgb urine dipstick LARGE (*)    Protein, ur >300 (*)    Leukocytes, UA SMALL (*)    All other components within normal limits  CBC WITH DIFFERENTIAL/PLATELET - Abnormal; Notable for the following:    WBC 15.2 (*)    Neutro Abs 11.7  (*)    All other components within normal limits  BASIC METABOLIC PANEL - Abnormal; Notable for the following:    Chloride 98 (*)    Glucose, Bld 407 (*)    Calcium 8.7 (*)    All other components within normal limits  URINE MICROSCOPIC-ADD ON - Abnormal; Notable for the following:    Bacteria, UA RARE (*)    All other components within normal limits  CBG MONITORING, ED - Abnormal; Notable for the following:    Glucose-Capillary 234 (*)    All other components within normal limits  URINE CULTURE    EKG  EKG Interpretation None       Radiology Ct Renal Stone Study  Result Date: 09/14/2015 CLINICAL DATA:  Right flank pain beginning 09/03/2015. Hematuria. History of kidney stones. Nausea and vomiting. EXAM: CT ABDOMEN AND PELVIS WITHOUT CONTRAST TECHNIQUE: Multidetector CT imaging of the abdomen and pelvis was performed following the standard protocol without IV contrast. COMPARISON:  None. FINDINGS: Lower chest:  Clear lung bases. Hepatobiliary: The liver is enlarged, measuring 20 cm in craniocaudal length. A subcentimeter calcification is noted in the inferior right hepatic lobe along the capsular margin. The gallbladder is unremarkable. No biliary dilatation is identified. Pancreas: Unremarkable. Spleen: Unremarkable. Adrenals/Urinary Tract: Low-density adrenal masses consistent with adenomas measuring 3.6 x 2.4 cm on the right and 2.3 x 1.7 cm on the left. There is a 2 mm obstructing stone in the mid right ureter (series 2, image 51) resulting in mild proximal ureteral dilatation and mild right hydronephrosis. There is mild right perinephric stranding. Several small right intrarenal calculi measure up to 3 mm in size. There are also a few punctate nonobstructing left renal calculi measuring up to 2-3 mm in size. Bladder is unremarkable. Stomach/Bowel: No evidence of bowel obstruction or inflammation. Moderate amount of colonic stool. Unremarkable appendix. Vascular/Lymphatic: Age advanced  aortoiliac atherosclerotic calcification. No aortic aneurysm. No enlarged lymph nodes. Reproductive: No mass or other significant abnormality. Other: No free fluid. 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 Mild-to-moderate disc space narrowing at L5-S1 with circumferential disc bulging and spurring resulting in mild right and moderate left neural foraminal stenosis. IMPRESSION: 1. 2 mm obstructing mid right ureteral stone.  Mild hydronephrosis. 2. Nonobstructing renal calculi bilaterally. 3. Bilateral adrenal adenomas. 4. Hepatomegaly. 5. Aortic atherosclerosis. Electronically Signed   By: Sebastian Ache M.D.   On: 09/14/2015 16:10    Procedures Procedures  DIAGNOSTIC STUDIES: Oxygen Saturation is 100% on RA, normal by my interpretation.    COORDINATION OF CARE: 1:43 PM Discussed next steps with pt. Pt verbalized understanding and is agreeable with the plan.    Medications Ordered in ED Medications  HYDROmorphone (DILAUDID) injection 1 mg (1 mg Intravenous Given 09/14/15 1404)  sodium chloride 0.9 % bolus 1,000 mL (0 mLs Intravenous Stopped 09/14/15 1505)  cefTRIAXone (ROCEPHIN) 1 g in dextrose 5 % 50 mL IVPB (0 g Intravenous Stopped 09/14/15 1435)  ondansetron (ZOFRAN) injection 4 mg (4 mg Intravenous Given 09/14/15 1404)  sodium chloride  0.9 % bolus 1,000 mL (0 mLs Intravenous Stopped 09/14/15 1642)  HYDROmorphone (DILAUDID) injection 1 mg (1 mg Intravenous Given 09/14/15 1652)  ketorolac (TORADOL) 30 MG/ML injection 15 mg (15 mg Intravenous Given 09/14/15 1752)     Initial Impression / Assessment and Plan / ED Course  I have reviewed the triage vital signs and the nursing notes.  Pertinent labs  results that were available during my care of the patient were reviewed by me and considered in my medical decision making (see chart for details).  Clinical Course    Discussed case with dr Perley Jainmcdiarmid with urology We reviewed labs/imaging If pt is afebrile and pain controlled He  recommends d/c home with pain meds and cipro Also to give patient strict return precautions 6:15 PM Pt improved He is afebrile Hyperglycemia improved Will d/c home We discussed return precautions   I personally performed the services described in this documentation, which was scribed in my presence. The recorded information has been reviewed and is accurate.      Final Clinical Impressions(s) / ED Diagnoses   Final diagnoses:  Kidney stone  Right ureteral stone  Hyperglycemia    New Prescriptions New Prescriptions   CIPROFLOXACIN (CIPRO) 500 MG TABLET    Take 1 tablet (500 mg total) by mouth 2 (two) times daily. One po bid x 7 days   IBUPROFEN (ADVIL,MOTRIN) 600 MG TABLET    Take 1 tablet (600 mg total) by mouth every 8 (eight) hours as needed.     Zadie Rhineonald Sam Wunschel, MD 09/14/15 [phone]

## 2015-09-14 NOTE — ED Notes (Signed)
Pt called for a room. Pt is not in waiting room at this time.

## 2015-09-14 NOTE — ED Notes (Signed)
MD at bedside. 

## 2015-09-14 NOTE — ED Notes (Signed)
#  20 ga IV removed from right AC.  No documentation of IV being placed while here

## 2015-09-14 NOTE — ED Triage Notes (Signed)
Pt comes in with right sided flank pain starting 09/03/15. States he has hematuria. Pt has hx of kidney stones. N/v present.

## 2015-09-17 LAB — URINE CULTURE: Culture: >=100000 — AB

## 2015-09-18 ENCOUNTER — Telehealth (HOSPITAL_BASED_OUTPATIENT_CLINIC_OR_DEPARTMENT_OTHER): Payer: Self-pay

## 2015-09-18 NOTE — Telephone Encounter (Signed)
Post ED Visit - Positive Culture Follow-up  Culture report reviewed by antimicrobial stewardship pharmacist:  []  Enzo BiNathan Batchelder, Pharm.D. []  Celedonio MiyamotoJeremy Frens, Pharm.D., BCPS [x]  Garvin FilaMike Maccia, Pharm.D. []  Georgina PillionElizabeth Martin, Pharm.D., BCPS []  [place]le[place]h Pham, [place]arm[place]D., BCPS, AAHIVP []  Estella HuskMichelle Turner, Pharm.D., BCPS, AAHIVP []  Tennis Mustassie Stewart, [address] []  Rob Oswaldo DoneVincent, [address]Ph[address]  Positive urine culture Treated with Ciprofloxacin, organism sensitive to the same and no further patient follow-up is required at this time.  Jerry CarasCullom, Anivea Velasques Burnett 09/18/2015, 9:18 AM

## 2015-11-10 DIAGNOSIS — L97513 Non-pressure chronic ulcer of other part of right foot with necrosis of muscle: Secondary | ICD-10-CM | POA: Diagnosis not present

## 2015-11-10 DIAGNOSIS — E11621 Type 2 diabetes mellitus with foot ulcer: Secondary | ICD-10-CM | POA: Diagnosis not present

## 2015-11-10 DIAGNOSIS — L97519 Non-pressure chronic ulcer of other part of right foot with unspecified severity: Secondary | ICD-10-CM | POA: Diagnosis not present

## 2015-11-14 DIAGNOSIS — E11621 Type 2 diabetes mellitus with foot ulcer: Secondary | ICD-10-CM | POA: Diagnosis not present

## 2015-11-14 DIAGNOSIS — L97519 Non-pressure chronic ulcer of other part of right foot with unspecified severity: Secondary | ICD-10-CM | POA: Diagnosis not present

## 2015-11-16 DIAGNOSIS — M869 Osteomyelitis, unspecified: Secondary | ICD-10-CM | POA: Diagnosis not present

## 2015-11-16 DIAGNOSIS — M79671 Pain in right foot: Secondary | ICD-10-CM | POA: Diagnosis not present

## 2015-11-17 DIAGNOSIS — L97513 Non-pressure chronic ulcer of other part of right foot with necrosis of muscle: Secondary | ICD-10-CM | POA: Diagnosis not present

## 2015-11-17 DIAGNOSIS — E11621 Type 2 diabetes mellitus with foot ulcer: Secondary | ICD-10-CM | POA: Diagnosis not present

## 2015-11-17 DIAGNOSIS — L97519 Non-pressure chronic ulcer of other part of right foot with unspecified severity: Secondary | ICD-10-CM | POA: Diagnosis not present

## 2015-11-24 DIAGNOSIS — L97513 Non-pressure chronic ulcer of other part of right foot with necrosis of muscle: Secondary | ICD-10-CM | POA: Diagnosis not present

## 2015-11-24 DIAGNOSIS — L97519 Non-pressure chronic ulcer of other part of right foot with unspecified severity: Secondary | ICD-10-CM | POA: Diagnosis not present

## 2015-11-24 DIAGNOSIS — E11621 Type 2 diabetes mellitus with foot ulcer: Secondary | ICD-10-CM | POA: Diagnosis not present

## 2015-11-29 DIAGNOSIS — L97519 Non-pressure chronic ulcer of other part of right foot with unspecified severity: Secondary | ICD-10-CM | POA: Diagnosis not present

## 2015-11-29 DIAGNOSIS — L97513 Non-pressure chronic ulcer of other part of right foot with necrosis of muscle: Secondary | ICD-10-CM | POA: Diagnosis not present

## 2015-11-29 DIAGNOSIS — E11621 Type 2 diabetes mellitus with foot ulcer: Secondary | ICD-10-CM | POA: Diagnosis not present

## 2015-12-06 DIAGNOSIS — L97519 Non-pressure chronic ulcer of other part of right foot with unspecified severity: Secondary | ICD-10-CM | POA: Diagnosis not present

## 2015-12-06 DIAGNOSIS — L97513 Non-pressure chronic ulcer of other part of right foot with necrosis of muscle: Secondary | ICD-10-CM | POA: Diagnosis not present

## 2015-12-06 DIAGNOSIS — E11621 Type 2 diabetes mellitus with foot ulcer: Secondary | ICD-10-CM | POA: Diagnosis not present

## 2015-12-15 DIAGNOSIS — E11621 Type 2 diabetes mellitus with foot ulcer: Secondary | ICD-10-CM | POA: Diagnosis not present

## 2015-12-15 DIAGNOSIS — L97513 Non-pressure chronic ulcer of other part of right foot with necrosis of muscle: Secondary | ICD-10-CM | POA: Diagnosis not present

## 2015-12-15 DIAGNOSIS — L97518 Non-pressure chronic ulcer of other part of right foot with other specified severity: Secondary | ICD-10-CM | POA: Diagnosis not present

## 2015-12-20 DIAGNOSIS — N2889 Other specified disorders of kidney and ureter: Secondary | ICD-10-CM | POA: Diagnosis not present

## 2015-12-20 DIAGNOSIS — E1165 Type 2 diabetes mellitus with hyperglycemia: Secondary | ICD-10-CM | POA: Diagnosis not present

## 2015-12-20 DIAGNOSIS — F17200 Nicotine dependence, unspecified, uncomplicated: Secondary | ICD-10-CM | POA: Diagnosis not present

## 2015-12-20 DIAGNOSIS — S37019A Minor contusion of unspecified kidney, initial encounter: Secondary | ICD-10-CM | POA: Diagnosis not present

## 2015-12-20 DIAGNOSIS — E119 Type 2 diabetes mellitus without complications: Secondary | ICD-10-CM | POA: Diagnosis not present

## 2015-12-20 DIAGNOSIS — Z7984 Long term (current) use of oral hypoglycemic drugs: Secondary | ICD-10-CM | POA: Diagnosis not present

## 2015-12-20 DIAGNOSIS — F17210 Nicotine dependence, cigarettes, uncomplicated: Secondary | ICD-10-CM | POA: Diagnosis not present

## 2015-12-20 DIAGNOSIS — S37011A Minor contusion of right kidney, initial encounter: Secondary | ICD-10-CM | POA: Diagnosis not present

## 2015-12-20 DIAGNOSIS — Z794 Long term (current) use of insulin: Secondary | ICD-10-CM | POA: Diagnosis not present

## 2015-12-20 DIAGNOSIS — R319 Hematuria, unspecified: Secondary | ICD-10-CM | POA: Diagnosis not present

## 2015-12-21 DIAGNOSIS — S37019A Minor contusion of unspecified kidney, initial encounter: Secondary | ICD-10-CM | POA: Diagnosis not present

## 2016-01-13 ENCOUNTER — Emergency Department (HOSPITAL_COMMUNITY): Payer: BLUE CROSS/BLUE SHIELD

## 2016-01-13 ENCOUNTER — Inpatient Hospital Stay (HOSPITAL_COMMUNITY)
Admission: EM | Admit: 2016-01-13 | Discharge: 2016-01-20 | DRG: 640 | Disposition: A | Discharge status: Home Health | Payer: BLUE CROSS/BLUE SHIELD | Attending: Internal Medicine | Admitting: Internal Medicine

## 2016-01-13 ENCOUNTER — Encounter (HOSPITAL_COMMUNITY): Payer: Self-pay

## 2016-01-13 DIAGNOSIS — N390 Urinary tract infection, site not specified: Secondary | ICD-10-CM | POA: Diagnosis not present

## 2016-01-13 DIAGNOSIS — E1151 Type 2 diabetes mellitus with diabetic peripheral angiopathy without gangrene: Secondary | ICD-10-CM | POA: Diagnosis present

## 2016-01-13 DIAGNOSIS — I10 Essential (primary) hypertension: Secondary | ICD-10-CM | POA: Diagnosis present

## 2016-01-13 DIAGNOSIS — B9561 Methicillin susceptible Staphylococcus aureus infection as the cause of diseases classified elsewhere: Secondary | ICD-10-CM | POA: Diagnosis present

## 2016-01-13 DIAGNOSIS — Z720 Tobacco use: Secondary | ICD-10-CM | POA: Diagnosis present

## 2016-01-13 DIAGNOSIS — E1165 Type 2 diabetes mellitus with hyperglycemia: Secondary | ICD-10-CM | POA: Diagnosis present

## 2016-01-13 DIAGNOSIS — M7989 Other specified soft tissue disorders: Secondary | ICD-10-CM | POA: Diagnosis not present

## 2016-01-13 DIAGNOSIS — R109 Unspecified abdominal pain: Secondary | ICD-10-CM | POA: Diagnosis not present

## 2016-01-13 DIAGNOSIS — K5909 Other constipation: Secondary | ICD-10-CM | POA: Diagnosis present

## 2016-01-13 DIAGNOSIS — D649 Anemia, unspecified: Secondary | ICD-10-CM | POA: Diagnosis not present

## 2016-01-13 DIAGNOSIS — N1330 Unspecified hydronephrosis: Secondary | ICD-10-CM | POA: Diagnosis present

## 2016-01-13 DIAGNOSIS — Z87442 Personal history of urinary calculi: Secondary | ICD-10-CM | POA: Diagnosis present

## 2016-01-13 DIAGNOSIS — S37011D Minor contusion of right kidney, subsequent encounter: Secondary | ICD-10-CM

## 2016-01-13 DIAGNOSIS — R7881 Bacteremia: Secondary | ICD-10-CM | POA: Diagnosis not present

## 2016-01-13 DIAGNOSIS — N2889 Other specified disorders of kidney and ureter: Secondary | ICD-10-CM | POA: Diagnosis not present

## 2016-01-13 DIAGNOSIS — N200 Calculus of kidney: Secondary | ICD-10-CM | POA: Diagnosis not present

## 2016-01-13 DIAGNOSIS — E870 Hyperosmolality and hypernatremia: Secondary | ICD-10-CM | POA: Diagnosis not present

## 2016-01-13 DIAGNOSIS — I70209 Unspecified atherosclerosis of native arteries of extremities, unspecified extremity: Secondary | ICD-10-CM | POA: Diagnosis present

## 2016-01-13 DIAGNOSIS — R739 Hyperglycemia, unspecified: Secondary | ICD-10-CM | POA: Diagnosis not present

## 2016-01-13 DIAGNOSIS — S37019A Minor contusion of unspecified kidney, initial encounter: Secondary | ICD-10-CM

## 2016-01-13 DIAGNOSIS — Z681 Body mass index (BMI) 19 or less, adult: Secondary | ICD-10-CM

## 2016-01-13 DIAGNOSIS — N132 Hydronephrosis with renal and ureteral calculous obstruction: Secondary | ICD-10-CM | POA: Diagnosis not present

## 2016-01-13 DIAGNOSIS — Z7984 Long term (current) use of oral hypoglycemic drugs: Secondary | ICD-10-CM

## 2016-01-13 DIAGNOSIS — E876 Hypokalemia: Secondary | ICD-10-CM | POA: Diagnosis present

## 2016-01-13 DIAGNOSIS — N151 Renal and perinephric abscess: Secondary | ICD-10-CM | POA: Diagnosis present

## 2016-01-13 DIAGNOSIS — F17210 Nicotine dependence, cigarettes, uncomplicated: Secondary | ICD-10-CM | POA: Diagnosis present

## 2016-01-13 DIAGNOSIS — E119 Type 2 diabetes mellitus without complications: Secondary | ICD-10-CM

## 2016-01-13 DIAGNOSIS — E109 Type 1 diabetes mellitus without complications: Secondary | ICD-10-CM | POA: Diagnosis not present

## 2016-01-13 HISTORY — DX: Minor contusion of unspecified kidney, initial encounter: S37.019A

## 2016-01-13 LAB — URINALYSIS, ROUTINE W REFLEX MICROSCOPIC
BILIRUBIN URINE: NEGATIVE
Bacteria, UA: NONE SEEN
Ketones, ur: NEGATIVE mg/dL
Nitrite: NEGATIVE
PH: 6 (ref 5.0–8.0)
Protein, ur: 100 mg/dL — AB
SPECIFIC GRAVITY, URINE: 1.017 (ref 1.005–1.030)

## 2016-01-13 LAB — CBC
HCT: 35.8 % — ABNORMAL LOW (ref 39.0–52.0)
Hemoglobin: 11.9 g/dL — ABNORMAL LOW (ref 13.0–17.0)
MCH: 30.9 pg (ref 26.0–34.0)
MCHC: 33.2 g/dL (ref 30.0–36.0)
MCV: 93 fL (ref 78.0–100.0)
PLATELETS: 249 10*3/uL (ref 150–400)
RBC: 3.85 MIL/uL — AB (ref 4.22–5.81)
RDW: 14.7 % (ref 11.5–15.5)
WBC: 8.8 10*3/uL (ref 4.0–10.5)

## 2016-01-13 LAB — BLOOD GAS, VENOUS
Acid-Base Excess: 4 mmol/L — ABNORMAL HIGH (ref 0.0–2.0)
Bicarbonate: 27 mmol/L (ref 20.0–28.0)
Drawn by: 1512
O2 Saturation: 73 %
Patient temperature: 37
pCO2, Ven: 47.8 mmHg (ref 44.0–60.0)
pH, Ven: 7.395 (ref 7.250–7.430)
pO2, Ven: 37.4 mmHg (ref 32.0–45.0)

## 2016-01-13 LAB — BASIC METABOLIC PANEL
Anion gap: 9 (ref 5–15)
BUN: 24 mg/dL — AB (ref 6–20)
CALCIUM: 8.2 mg/dL — AB (ref 8.9–10.3)
CO2: 28 mmol/L (ref 22–32)
CREATININE: 1.33 mg/dL — AB (ref 0.61–1.24)
Chloride: 87 mmol/L — ABNORMAL LOW (ref 101–111)
GFR calc non Af Amer: >60 mL/min (ref >60)
Glucose, Bld: 828 mg/dL (ref 65–99)
Potassium: 3.6 mmol/L (ref 3.5–5.1)
Sodium: 124 mmol/L — ABNORMAL LOW (ref 135–145)

## 2016-01-13 LAB — I-STAT CG4 LACTIC ACID, ED: Lactic Acid, Venous: 0.98 mmol/L (ref 0.5–1.9)

## 2016-01-13 LAB — POC OCCULT BLOOD, ED: Fecal Occult Bld: NEGATIVE

## 2016-01-13 MED ORDER — DEXTROSE-NACL 5-0.45 % IV SOLN: INTRAVENOUS | Status: DC

## 2016-01-13 MED ORDER — SODIUM CHLORIDE 0.9 % IV BOLUS (SEPSIS)
1000.0000 mL | Freq: Once | INTRAVENOUS | Status: AC
  Administered 2016-01-14: 1000 mL via INTRAVENOUS

## 2016-01-13 MED ORDER — SODIUM CHLORIDE 0.9 % IV SOLN
INTRAVENOUS | Status: DC
  Filled 2016-01-13: qty 2.5

## 2016-01-13 MED ORDER — DEXTROSE 5 % IV SOLN
1.0000 g | Freq: Once | INTRAVENOUS | Status: AC
  Administered 2016-01-14: 1 g via INTRAVENOUS
  Filled 2016-01-13: qty 10

## 2016-01-13 MED ORDER — SODIUM CHLORIDE 0.9 % IV SOLN
INTRAVENOUS | Status: DC
  Administered 2016-01-14: 02:00:00 via INTRAVENOUS

## 2016-01-13 MED ORDER — INSULIN ASPART 100 UNIT/ML ~~LOC~~ SOLN
10.0000 [IU] | Freq: Once | SUBCUTANEOUS | Status: AC
  Administered 2016-01-13: 10 [IU] via SUBCUTANEOUS
  Filled 2016-01-13: qty 1

## 2016-01-13 MED ORDER — SODIUM CHLORIDE 0.9 % IV BOLUS (SEPSIS): 1000.0000 mL | Freq: Once | INTRAVENOUS | Status: DC

## 2016-01-13 MED ORDER — SODIUM CHLORIDE 0.9 % IV SOLN
1000.0000 mL | Freq: Once | INTRAVENOUS | Status: AC
  Administered 2016-01-13: 1000 mL via INTRAVENOUS

## 2016-01-13 NOTE — ED Notes (Signed)
Given  water  to drink

## 2016-01-13 NOTE — ED Provider Notes (Signed)
[facility] Provider Note   CSN: [id number] Arrival date & time: 01/13/16  1857  By signing my name below, I, Javier Dockerobert Ryan Halas, attest that this documentation has been prepared under the direction and in the presence of Glynn OctaveStephen Mindee Robledo, MD. Electronically Signed: Javier Dockerobert Ryan Halas, ER Scribe. 08/20/2015. 12:23 AM.  History   Chief Complaint Chief Complaint  Patient presents with  . Weakness   The history is provided by the patient. No language interpreter was used.  Weakness  Associated symptoms include vomiting.    HPI Comments: Allen Mooney is a 46 y.o. male who presents to the Emergency Department complaining of 48 hours of visual changes, low energy, leg swelling, and frequent urination. He states he has had right sided flank pain for two weeks. He has a PMHx of DM. He takes metformin three times per day normally for his DM. He does not use insulin. He had two events of vomiting yesterday, none today. His sugars have been running in the 200s recently. He has been eating and drinking normally today. He endorses constipation. He denies dysuria, hematuria, fever, abdominal pain or loss of bowel or bladder control.   [facility] 12/12 due to pari nephric hematoma and was held until 12/14 for observation. They did not give him any units of blood at [facility] or perform surgery. Discharge hemoglobin on 12/22/15 was 10.9. His flank pain started suddenly when he heard a popping sound while urinating.   Past Medical History:  Diagnosis Date  . Diabetes mellitus without complication (HCC)   . Kidney stones   . Perinephric hematoma     There are no active problems to display for this patient.   History reviewed. No pertinent surgical history.     Home Medications    Prior to Admission medications   Medication Sig Start Date End Date Taking? Authorizing Provider  metFORMIN (GLUCOPHAGE) 500 MG tablet Take 500 mg by mouth 3 (three) times  daily.    Yes Historical Provider, MD    Family History No family history on file.  Social History Social History  Substance Use Topics  . Smoking status: Current Every Day Smoker    Packs/day: 1.00    Types: Cigarettes  . Smokeless tobacco: Never Used  . Alcohol use No     Allergies   Patient has no known allergies.   Review of Systems Review of Systems  Constitutional: Negative for chills and fever.  Eyes: Positive for visual disturbance.  Cardiovascular: Positive for leg swelling.  Gastrointestinal: Positive for nausea and vomiting.  Genitourinary: Positive for flank pain and frequency. Negative for dysuria.  Skin: Positive for wound.  Neurological: Positive for weakness.   A complete 10 system review of systems was obtained and all systems are negative except as noted in the HPI and PMH.    Physical Exam Updated Vital Signs BP 121/85 (BP Location: Left Arm)   Pulse 99   Temp 98.7 F (37.1 C) (Oral)   Resp 18   Ht 5\' 11"  (1.803 m)   Wt 145 lb (65.8 kg)   SpO2 94%   BMI 20.22 kg/m   Physical Exam  Constitutional: He is oriented to person, place, and time. He appears well-developed and well-nourished. No distress.  HENT:  Head: Normocephalic and atraumatic.  Mouth/Throat: Oropharynx is clear and moist. No oropharyngeal exudate.  Dry mucus membranes  Eyes: Conjunctivae and EOM are normal. Pupils are equal, round, and reactive to light.  Neck: Normal range of motion. Neck supple.  No meningismus.  Cardiovascular: Normal rate, regular rhythm, normal heart sounds and intact distal pulses.   No murmur heard. Pulmonary/Chest: Effort normal and breath sounds normal. No respiratory distress.  Abdominal: Soft. There is no tenderness. There is no rebound and no guarding.  R CVAT  Musculoskeletal: Normal range of motion. He exhibits no edema or tenderness.  Neurological: He is alert and oriented to person, place, and time. No cranial nerve deficit. He exhibits  normal muscle tone. Coordination normal.  No ataxia on finger to nose bilaterally. No pronator drift. 5/5 strength throughout. CN 2-12 intact.Equal grip strength. Sensation intact.   Skin: Skin is warm.  Psychiatric: He has a normal mood and affect. His behavior is normal.  Nursing note and vitals reviewed.    ED Treatments / Results  DIAGNOSTIC STUDIES: Oxygen Saturation is 93% on RA, adequate by my interpretation.    COORDINATION OF CARE: 12:05 AM Discussed treatment plan with pt at bedside and pt agreed to plan.  Labs (all labs ordered are listed, but only abnormal results are displayed) Labs Reviewed  BASIC METABOLIC PANEL - Abnormal; Notable for the following:       Result Value   Sodium 124 (*)    Chloride 87 (*)    Glucose, Bld 828 (*)    BUN 24 (*)    Creatinine, Ser 1.33 (*)    Calcium 8.2 (*)    All other components within normal limits  CBC - Abnormal; Notable for the following:    RBC 3.85 (*)    Hemoglobin 11.9 (*)    HCT 35.8 (*)    All other components within normal limits  URINALYSIS, ROUTINE W REFLEX MICROSCOPIC - Abnormal; Notable for the following:    Color, Urine STRAW (*)    APPearance HAZY (*)    Glucose, UA >=500 (*)    Hgb urine dipstick MODERATE (*)    Protein, ur 100 (*)    Leukocytes, UA MODERATE (*)    All other components within normal limits  BLOOD GAS, VENOUS - Abnormal; Notable for the following:    Acid-Base Excess 4.0 (*)    All other components within normal limits  CBG MONITORING, ED - Abnormal; Notable for the following:    Glucose-Capillary 484 (*)    All other components within normal limits  CULTURE, BLOOD (ROUTINE X 2)  CULTURE, BLOOD (ROUTINE X 2)  URINE CULTURE  PROTIME-INR  POC OCCULT BLOOD, ED  I-STAT CG4 LACTIC ACID, ED  TYPE AND SCREEN    EKG  EKG Interpretation None       Radiology Dg Chest 2 View  Result Date: 01/14/2016 CLINICAL DATA:  Visual changes low energy and leg swelling EXAM: CHEST  2 VIEW  COMPARISON:  11/23/2014 FINDINGS: Lungs are mildly hyperinflated. Suspect right greater than left apical bulla. No acute infiltrate no consolidation or effusion. Normal cardiomediastinal silhouette. No pneumothorax. IMPRESSION: No radiographic evidence for acute cardiopulmonary abnormality Electronically Signed   By: Jasmine Pang M.D.   On: 01/14/2016 00:25   Ct Renal Stone Study  Result Date: 01/14/2016 CLINICAL DATA:  Weakness nausea and lower extremity swelling EXAM: CT ABDOMEN AND PELVIS WITHOUT CONTRAST TECHNIQUE: Multidetector CT imaging of the abdomen and pelvis was performed following the standard protocol without IV contrast. COMPARISON:  12/20/2015, 09/14/2015 FINDINGS: Lower chest: Small foci of ground-glass density in the right lower lobe suggests mild inflammation or infection. No consolidation or effusion. Normal heart size. Coronary artery  calcifications. Hepatobiliary: No focal hepatic abnormality. No biliary dilatation. No calcified gallstones. Tiny calcification along the surface of the right anterior inferior liver. Pancreas: Unremarkable. No pancreatic ductal dilatation or surrounding inflammatory changes. Spleen: Spleen upper normal in size.  No focal splenic abnormality Adrenals/Urinary Tract: Stable 2.3 cm left adrenal gland adenoma. Stable 3.4 cm right adrenal gland adenoma. Mild left hydronephrosis, slightly increased compared to prior. No stone identified along the course of the left ureter. Punctate nonobstructing stone mid pole left kidney. Interval increase in right perinephric fluid collection/probable hematoma, now large. This measures approximately 9.1 cm transverse by 4.4 cm AP thick by 16.7 cm craniocaudad. Small amount of increased density is present in at the posterior inferior aspect of the collection. The right kidney is displaced anteriorly. Multiple nonobstructing stones within the right kidney. No right hydronephrosis. Mild asymmetric enlargement of the right. Hematoma is  difficult to separate from the ileus psoas muscle on the right, this may be secondary to retroperitoneal hemorrhage or mass effect on the ileus psoas muscle. Stomach/Bowel: The stomach is nonenlarged. No dilated small bowel to suggest obstruction. Extensive stool throughout the colon. No colon wall thickening. Vascular/Lymphatic: Got kicked atherosclerotic vascular calcifications. Scattered subcentimeter mesenteric nodes. Reproductive: Prostate is unremarkable. Other: No free air or free fluid. 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 IMPRESSION: 1. Significant interval increase in size of a complex fluid right perinephric fluid collection, suspect for hematoma. This exerts mass effect on the right kidney which is displaced anteriorly. There is mass effect on the right iliopsoas muscle. A small amount of hemorrhage or fluid extends along or is within a portion of the right ileus psoas muscle which appears asymmetric compared to the left side. 2. Punctate stones are present in the right kidney. No hydronephrosis. 3. Mild left hydronephrosis. Punctate stone present in the mid left kidney. No ureteral stone identified on the left. 4. No evidence of a bowel obstruction. Large amount of stool is present throughout the colon 5. Stable bilateral adrenal gland adenomas. Electronically Signed   By: Jasmine Pang M.D.   On: 01/14/2016 00:23    Procedures Procedures (including critical care time)  Medications Ordered in ED Medications  sodium chloride 0.9 % bolus 1,000 mL (1,000 mLs Intravenous New Bag/Given 01/14/16 0027)  sodium chloride 0.9 % bolus 1,000 mL (not administered)    And  sodium chloride 0.9 % bolus 1,000 mL (not administered)    And  0.9 %  sodium chloride infusion (not administered)  dextrose 5 %-0.45 % sodium chloride infusion (not administered)  0.9 %  sodium chloride infusion (0 mLs Intravenous Stopped 01/14/16 0027)  insulin aspart (novoLOG) injection 10  Units (10 Units Subcutaneous Given 01/13/16 2340)  cefTRIAXone (ROCEPHIN) 1 g in dextrose 5 % 50 mL IVPB (1 g Intravenous New Bag/Given 01/14/16 0032)     Initial Impression / Assessment and Plan / ED Course  I have reviewed the triage vital signs and the nursing notes.  Pertinent labs & imaging results that were available during my care of the patient were reviewed by me and considered in my medical decision making (see chart for details).  Clinical Course    Patient presents with fatigue, blurry vision, nausea, generalized weakness onset today. Hospitalized 3 weeks ago for perinephric hematoma that did not require surgery and was monitored with observation at [facility]. Reports ongoing right flank pain since then that is similar. Denies fever.  No distress. Vitals are stable. Patient found to be hyperglycemic  over 800. Normal anion gap. Patient given IV fluids and IV insulin.  [facility] records reviewed which showed perinephric hematoma. Hemoglobin today is increased from discharge hemoglobin. Hematoma was 4.9 x 8.5 x 17.7 cm on 12/13.  CT scan today shows increased size of right perinephric hematoma. This was discussed with Dr. Sherryl Barters urology. He feels this is expected in the size is not much change from previous scan. Hemoglobin is improved since discharge hemoglobin. Does not recommend any kind of urological intervention.  Sugar improved to 484.  Continue IVF and insulin.   Admission d/w Dr. Conley Rolls.  Hold on insulin gtt at this time.  Blood sugar improving. Will need monitoring of blood sugar and hemoglobin.    CRITICAL CARE Performed by: Glynn Octave Total critical care time: 35 minutes Critical care time was exclusive of separately billable procedures and treating other patients. Critical care was necessary to treat or prevent imminent or life-threatening deterioration. Critical care was time spent personally by me on the following activities: development of treatment plan  with patient and/or surrogate as well as nursing, discussions with consultants, evaluation of patient's response to treatment, examination of patient, obtaining history from patient or surrogate, ordering and performing treatments and interventions, ordering and review of laboratory studies, ordering and review of radiographic studies, pulse oximetry and re-evaluation of patient's condition.  Final Clinical Impressions(s) / ED Diagnoses   Final diagnoses:  Flank pain  Hyperglycemia  Perinephric hematoma    New Prescriptions New Prescriptions   No medications on file     I personally performed the services described in this documentation, which was scribed in my presence. The recorded information has been reviewed and is accurate.      Glynn Octave, MD 01/14/16 [phone]

## 2016-01-13 NOTE — ED Notes (Signed)
CRITICAL VALUE ALERT  Critical value received:  Glucose 828  Date of notification:  01/13/16  Time of notification:  2310  Critical value read back:Yes.    Nurse who received alert:  Rudene AndaSusan Frye-Yarbrough, RN  Responding MD:  Dr. Aileen PilotZammitt  Time MD responded:  [phone]

## 2016-01-13 NOTE — ED Notes (Signed)
Pt reports pain 8/10 to rt lower back, but upon entering room pt was asleep snoring

## 2016-01-13 NOTE — ED Triage Notes (Signed)
Patient here today complaining of weakness, nausea, and had double vision today.  [facility].  States that his lower legs have been swelling [facility].

## 2016-01-14 ENCOUNTER — Encounter (HOSPITAL_COMMUNITY): Payer: Self-pay | Admitting: Internal Medicine

## 2016-01-14 DIAGNOSIS — N1330 Unspecified hydronephrosis: Secondary | ICD-10-CM | POA: Diagnosis not present

## 2016-01-14 DIAGNOSIS — E876 Hypokalemia: Secondary | ICD-10-CM | POA: Diagnosis present

## 2016-01-14 DIAGNOSIS — Z720 Tobacco use: Secondary | ICD-10-CM | POA: Diagnosis present

## 2016-01-14 DIAGNOSIS — Z87442 Personal history of urinary calculi: Secondary | ICD-10-CM | POA: Diagnosis not present

## 2016-01-14 DIAGNOSIS — S37011D Minor contusion of right kidney, subsequent encounter: Secondary | ICD-10-CM | POA: Diagnosis not present

## 2016-01-14 DIAGNOSIS — R739 Hyperglycemia, unspecified: Secondary | ICD-10-CM | POA: Diagnosis not present

## 2016-01-14 DIAGNOSIS — F17210 Nicotine dependence, cigarettes, uncomplicated: Secondary | ICD-10-CM | POA: Diagnosis present

## 2016-01-14 DIAGNOSIS — I70209 Unspecified atherosclerosis of native arteries of extremities, unspecified extremity: Secondary | ICD-10-CM | POA: Diagnosis not present

## 2016-01-14 DIAGNOSIS — E109 Type 1 diabetes mellitus without complications: Secondary | ICD-10-CM | POA: Diagnosis not present

## 2016-01-14 DIAGNOSIS — R7881 Bacteremia: Secondary | ICD-10-CM | POA: Diagnosis not present

## 2016-01-14 DIAGNOSIS — D649 Anemia, unspecified: Secondary | ICD-10-CM | POA: Diagnosis not present

## 2016-01-14 DIAGNOSIS — E1151 Type 2 diabetes mellitus with diabetic peripheral angiopathy without gangrene: Secondary | ICD-10-CM | POA: Diagnosis present

## 2016-01-14 DIAGNOSIS — S37019A Minor contusion of unspecified kidney, initial encounter: Secondary | ICD-10-CM | POA: Diagnosis not present

## 2016-01-14 DIAGNOSIS — N151 Renal and perinephric abscess: Secondary | ICD-10-CM | POA: Diagnosis not present

## 2016-01-14 DIAGNOSIS — E870 Hyperosmolality and hypernatremia: Secondary | ICD-10-CM | POA: Diagnosis not present

## 2016-01-14 DIAGNOSIS — N200 Calculus of kidney: Secondary | ICD-10-CM | POA: Diagnosis not present

## 2016-01-14 DIAGNOSIS — N390 Urinary tract infection, site not specified: Secondary | ICD-10-CM | POA: Diagnosis present

## 2016-01-14 DIAGNOSIS — E1165 Type 2 diabetes mellitus with hyperglycemia: Secondary | ICD-10-CM | POA: Diagnosis present

## 2016-01-14 DIAGNOSIS — I10 Essential (primary) hypertension: Secondary | ICD-10-CM | POA: Diagnosis present

## 2016-01-14 DIAGNOSIS — Z681 Body mass index (BMI) 19 or less, adult: Secondary | ICD-10-CM | POA: Diagnosis not present

## 2016-01-14 DIAGNOSIS — K5909 Other constipation: Secondary | ICD-10-CM | POA: Diagnosis present

## 2016-01-14 DIAGNOSIS — B9561 Methicillin susceptible Staphylococcus aureus infection as the cause of diseases classified elsewhere: Secondary | ICD-10-CM | POA: Diagnosis present

## 2016-01-14 DIAGNOSIS — M7989 Other specified soft tissue disorders: Secondary | ICD-10-CM | POA: Diagnosis not present

## 2016-01-14 DIAGNOSIS — Z7984 Long term (current) use of oral hypoglycemic drugs: Secondary | ICD-10-CM | POA: Diagnosis not present

## 2016-01-14 DIAGNOSIS — N132 Hydronephrosis with renal and ureteral calculous obstruction: Secondary | ICD-10-CM | POA: Diagnosis present

## 2016-01-14 LAB — GLUCOSE, CAPILLARY
GLUCOSE-CAPILLARY: 291 mg/dL — AB (ref 65–99)
GLUCOSE-CAPILLARY: 348 mg/dL — AB (ref 65–99)
GLUCOSE-CAPILLARY: 422 mg/dL — AB (ref 65–99)
Glucose-Capillary: 201 mg/dL — ABNORMAL HIGH (ref 65–99)
Glucose-Capillary: 226 mg/dL — ABNORMAL HIGH (ref 65–99)
Glucose-Capillary: 286 mg/dL — ABNORMAL HIGH (ref 65–99)
Glucose-Capillary: 287 mg/dL — ABNORMAL HIGH (ref 65–99)
Glucose-Capillary: 353 mg/dL — ABNORMAL HIGH (ref 65–99)
Glucose-Capillary: 354 mg/dL — ABNORMAL HIGH (ref 65–99)
Glucose-Capillary: 72 mg/dL (ref 65–99)
Glucose-Capillary: 91 mg/dL (ref 65–99)

## 2016-01-14 LAB — TYPE AND SCREEN
ABO/RH(D): O NEG
Antibody Screen: NEGATIVE

## 2016-01-14 LAB — CBC
HCT: 31.3 % — ABNORMAL LOW (ref 39.0–52.0)
HEMATOCRIT: 34.5 % — AB (ref 39.0–52.0)
Hemoglobin: 11.6 g/dL — ABNORMAL LOW (ref 13.0–17.0)
Hemoglobin: 10.5 g/dL — ABNORMAL LOW (ref 13.0–17.0)
MCH: 31 pg (ref 26.0–34.0)
MCH: 31.1 pg (ref 26.0–34.0)
MCHC: 33.6 g/dL (ref 30.0–36.0)
MCHC: 33.5 g/dL (ref 30.0–36.0)
MCV: 92.2 fL (ref 78.0–100.0)
MCV: 92.6 fL (ref 78.0–100.0)
PLATELETS: 216 10*3/uL (ref 150–400)
Platelets: 270 10*3/uL (ref 150–400)
RBC: 3.74 MIL/uL — ABNORMAL LOW (ref 4.22–5.81)
RBC: 3.38 MIL/uL — ABNORMAL LOW (ref 4.22–5.81)
RDW: 14.5 % (ref 11.5–15.5)
RDW: 14.6 % (ref 11.5–15.5)
WBC: 9.5 10*3/uL (ref 4.0–10.5)
WBC: 9.3 10*3/uL (ref 4.0–10.5)

## 2016-01-14 LAB — BASIC METABOLIC PANEL
ANION GAP: 6 (ref 5–15)
BUN: 16 mg/dL (ref 6–20)
CO2: 27 mmol/L (ref 22–32)
Calcium: 7.4 mg/dL — ABNORMAL LOW (ref 8.9–10.3)
Chloride: 100 mmol/L — ABNORMAL LOW (ref 101–111)
Creatinine, Ser: 0.91 mg/dL (ref 0.61–1.24)
GFR calc Af Amer: >60 mL/min (ref >60)
GFR calc non Af Amer: >60 mL/min (ref >60)
GLUCOSE: 397 mg/dL — AB (ref 65–99)
POTASSIUM: 3.4 mmol/L — AB (ref 3.5–5.1)
Sodium: 133 mmol/L — ABNORMAL LOW (ref 135–145)

## 2016-01-14 LAB — PROTIME-INR
INR: 0.91
Prothrombin Time: 12.2 seconds (ref 11.4–15.2)

## 2016-01-14 LAB — BLOOD CULTURE ID PANEL (REFLEXED)
ACINETOBACTER BAUMANNII: NOT DETECTED
CANDIDA ALBICANS: NOT DETECTED
CANDIDA KRUSEI: NOT DETECTED
CANDIDA PARAPSILOSIS: NOT DETECTED
Candida glabrata: NOT DETECTED
Candida tropicalis: NOT DETECTED
ENTEROBACTERIACEAE SPECIES: NOT DETECTED
ESCHERICHIA COLI: NOT DETECTED
Enterobacter cloacae complex: NOT DETECTED
Enterococcus species: NOT DETECTED
Haemophilus influenzae: NOT DETECTED
Klebsiella oxytoca: NOT DETECTED
Klebsiella pneumoniae: NOT DETECTED
Listeria monocytogenes: NOT DETECTED
Methicillin resistance: NOT DETECTED
Neisseria meningitidis: NOT DETECTED
PSEUDOMONAS AERUGINOSA: NOT DETECTED
Proteus species: NOT DETECTED
STREPTOCOCCUS PNEUMONIAE: NOT DETECTED
STREPTOCOCCUS PYOGENES: NOT DETECTED
Serratia marcescens: NOT DETECTED
Staphylococcus aureus (BCID): DETECTED — AB
Staphylococcus species: DETECTED — AB
Streptococcus agalactiae: NOT DETECTED
Streptococcus species: NOT DETECTED

## 2016-01-14 LAB — CBG MONITORING, ED
Glucose-Capillary: 484 mg/dL — ABNORMAL HIGH (ref 65–99)
Glucose-Capillary: 495 mg/dL — ABNORMAL HIGH (ref 65–99)
Glucose-Capillary: 505 mg/dL (ref 65–99)

## 2016-01-14 LAB — RAPID URINE DRUG SCREEN, HOSP PERFORMED
AMPHETAMINES: NOT DETECTED
BENZODIAZEPINES: NOT DETECTED
Barbiturates: NOT DETECTED
Cocaine: NOT DETECTED
Opiates: NOT DETECTED
TETRAHYDROCANNABINOL: NOT DETECTED

## 2016-01-14 LAB — HEMOGLOBIN AND HEMATOCRIT, BLOOD
HCT: 30.9 % — ABNORMAL LOW (ref 39.0–52.0)
Hemoglobin: 10.3 g/dL — ABNORMAL LOW (ref 13.0–17.0)

## 2016-01-14 MED ORDER — HYDROCODONE-ACETAMINOPHEN 5-325 MG PO TABS
1.0000 | ORAL_TABLET | Freq: Four times a day (QID) | ORAL | Status: AC | PRN
  Administered 2016-01-14 – 2016-01-16 (×4): 1 via ORAL
  Filled 2016-01-14 (×4): qty 1

## 2016-01-14 MED ORDER — INSULIN ASPART 100 UNIT/ML ~~LOC~~ SOLN
6.0000 [IU] | Freq: Once | SUBCUTANEOUS | Status: AC
  Administered 2016-01-14: 6 [IU] via INTRAVENOUS

## 2016-01-14 MED ORDER — SENNOSIDES-DOCUSATE SODIUM 8.6-50 MG PO TABS
2.0000 | ORAL_TABLET | Freq: Two times a day (BID) | ORAL | Status: DC
  Administered 2016-01-15 – 2016-01-20 (×10): 2 via ORAL
  Filled 2016-01-14 (×10): qty 2

## 2016-01-14 MED ORDER — DEXTROSE 50 % IV SOLN: 25.0000 mL | INTRAVENOUS | Status: DC | PRN

## 2016-01-14 MED ORDER — INSULIN ASPART 100 UNIT/ML ~~LOC~~ SOLN
SUBCUTANEOUS | Status: AC
  Filled 2016-01-14: qty 1

## 2016-01-14 MED ORDER — POTASSIUM CHLORIDE IN NACL 40-0.9 MEQ/L-% IV SOLN
INTRAVENOUS | Status: DC
  Administered 2016-01-14: 150 mL/h via INTRAVENOUS

## 2016-01-14 MED ORDER — SODIUM CHLORIDE 0.9 % IV SOLN
INTRAVENOUS | Status: AC
  Filled 2016-01-14: qty 2.5

## 2016-01-14 MED ORDER — INSULIN ASPART 100 UNIT/ML ~~LOC~~ SOLN: 6.0000 [IU] | Freq: Three times a day (TID) | SUBCUTANEOUS | Status: DC

## 2016-01-14 MED ORDER — INSULIN ASPART 100 UNIT/ML ~~LOC~~ SOLN
0.0000 [IU] | Freq: Three times a day (TID) | SUBCUTANEOUS | Status: DC
Start: 2016-01-14 — End: 2016-01-14
  Administered 2016-01-14: 15 [IU] via SUBCUTANEOUS
  Administered 2016-01-14: 8 [IU] via SUBCUTANEOUS

## 2016-01-14 MED ORDER — POLYETHYLENE GLYCOL 3350 17 G PO PACK
17.0000 g | PACK | Freq: Two times a day (BID) | ORAL | Status: DC
  Administered 2016-01-15 – 2016-01-16 (×3): 17 g via ORAL
  Filled 2016-01-14 (×3): qty 1

## 2016-01-14 MED ORDER — INSULIN ASPART 100 UNIT/ML ~~LOC~~ SOLN: 0.0000 [IU] | Freq: Every day | SUBCUTANEOUS | Status: DC

## 2016-01-14 MED ORDER — INSULIN REGULAR HUMAN 100 UNIT/ML IJ SOLN
INTRAMUSCULAR | Status: DC
  Administered 2016-01-14: 3 [IU]/h via INTRAVENOUS
  Filled 2016-01-14: qty 2.5

## 2016-01-14 MED ORDER — SODIUM CHLORIDE 0.9 % IV SOLN: INTRAVENOUS | Status: DC

## 2016-01-14 MED ORDER — INSULIN STARTER KIT- PEN NEEDLES (ENGLISH)
1.0000 | Freq: Once | Status: AC
  Administered 2016-01-14: 1
  Filled 2016-01-14: qty 1

## 2016-01-14 MED ORDER — INSULIN REGULAR BOLUS VIA INFUSION
0.0000 [IU] | Freq: Three times a day (TID) | INTRAVENOUS | Status: DC
  Filled 2016-01-14: qty 10

## 2016-01-14 MED ORDER — INSULIN ASPART 100 UNIT/ML ~~LOC~~ SOLN
5.0000 [IU] | Freq: Three times a day (TID) | SUBCUTANEOUS | Status: DC
  Administered 2016-01-14: 5 [IU] via SUBCUTANEOUS

## 2016-01-14 MED ORDER — MORPHINE SULFATE (PF) 2 MG/ML IV SOLN
1.0000 mg | INTRAVENOUS | Status: AC | PRN
  Administered 2016-01-14 – 2016-01-15 (×4): 1 mg via INTRAVENOUS
  Filled 2016-01-14 (×4): qty 1

## 2016-01-14 MED ORDER — INSULIN GLARGINE 100 UNIT/ML ~~LOC~~ SOLN
15.0000 [IU] | SUBCUTANEOUS | Status: DC
  Administered 2016-01-14: 15 [IU] via SUBCUTANEOUS
  Filled 2016-01-14 (×3): qty 0.15

## 2016-01-14 MED ORDER — CEFTRIAXONE SODIUM 1 G IJ SOLR
1.0000 g | INTRAMUSCULAR | Status: DC
  Administered 2016-01-15: 1 g via INTRAVENOUS
  Filled 2016-01-14 (×2): qty 10

## 2016-01-14 MED ORDER — INSULIN GLARGINE 100 UNIT/ML ~~LOC~~ SOLN
20.0000 [IU] | SUBCUTANEOUS | Status: DC
  Filled 2016-01-14 (×2): qty 0.2

## 2016-01-14 MED ORDER — DEXTROSE-NACL 5-0.45 % IV SOLN
INTRAVENOUS | Status: DC
  Administered 2016-01-14: 21:00:00 via INTRAVENOUS

## 2016-01-14 MED ORDER — SENNOSIDES-DOCUSATE SODIUM 8.6-50 MG PO TABS
1.0000 | ORAL_TABLET | Freq: Two times a day (BID) | ORAL | Status: DC
  Administered 2016-01-14: 1 via ORAL
  Filled 2016-01-14: qty 1

## 2016-01-14 MED ORDER — SODIUM CHLORIDE 0.9 % IV SOLN
INTRAVENOUS | Status: DC
  Administered 2016-01-14: 18:00:00 via INTRAVENOUS

## 2016-01-14 NOTE — Progress Notes (Signed)
Pt. Transferred to ICU. Alert and oriented. Nn complaints.

## 2016-01-14 NOTE — Progress Notes (Signed)
01/13/2016  9:41 PM  01/14/2016 1:22 PM  Allen Mooney was seen and examined.  The H&P by the admitting provider , orders, imaging was reviewed.  Please see orders.  Will continue to follow.   [name]. Johnson, MD [facility]

## 2016-01-14 NOTE — H&P (Signed)
History and Physical    Allen Mooney:[id number] DOB: 11-Jul-1970 DOA: 01/13/2016  PCP: Toma Deiters, MD  Patient coming from: Home.    Chief Complaint:  Weakness, frequent urination and increased thirst.   HPI: Allen Mooney is an 46 y.o. male with hx of DM, HTN, known nephrolithiasis, recent Dx of perinephric hematoma, presented to the ER complaining of feeling weak, increased thrist and polyuria.  He has no fever, chills, nausea or vomiting.  He was evaluated and found to have a BS of 800's with concomitant pseudohyponatremia of 124, Hb of 11 g per dL, and an abdominal pelvic CT showed increasing size of the pernephric hematoma with resulting shift of the right kidney.  He also found to have a small kidney stone causing mild left hydronephrosis.  He was given IVF and SQ insulin, and his CBG did drop to 400's, prior to rising to 500's.  His UA was positive and he was started on IV Roc[place]hin.  EDP spoke with urologist and hospitalist was asked to admit him for hyperosmolar syndrome, along with enlarging perinephric hematoma, and UTI with neprholithiasis and mild hydronephrosis.    ED Course:  See above.  Rewiew of Systems:  Constitutional: Negative for malaise, fever and chills. No significant weight loss or weight gain Eyes: Negative for eye pain, redness and discharge, diplopia, visual changes, or flashes of light. ENMT: Negative for ear pain, hoarseness, nasal congestion, sinus pressure and sore throat. No headaches; tinnitus, drooling, or problem swallowing. Cardiovascular: Negative for chest pain, palpitations, diaphoresis, dyspnea and peripheral edema. ; No orthopnea, PND Respiratory: Negative for cough, hemoptysis, wheezing and stridor. No pleuritic chestpain. Gastrointestinal: Negative for diarrhea, constipation,  melena, blood in stool, hematemesis, jaundice and rectal bleeding.    Genitourinary: Negative for frequency, dysuria, incontinence,flank pain and  hematuria; Musculoskeletal: Negative for back pain and neck pain. Negative for swelling and trauma.;  Skin: . Negative for pruritus, rash, abrasions, bruising and skin lesion.; ulcerations Neuro: Negative for headache, lightheadedness and neck stiffness. Negative for weakness, altered level of consciousness , altered mental status,burning feet, involuntary movement, seizure and syncope.  Psych: negative for anxiety, depression, insomnia, tearfulness, panic attacks, hallucinations, paranoia, suicidal or homicidal ideation   Past Medical History:  Diagnosis Date  . Diabetes mellitus without complication (HCC)   . Kidney stones   . Perinephric hematoma    History reviewed. No pertinent surgical history.   reports that he has been smoking Cigarettes.  He has been smoking about 1.00 pack per day. He has never used smokeless tobacco. He reports that he does not drink alcohol or use drugs.  No Known Allergies  History reviewed. No pertinent family history.   Prior to Admission medications   Medication Sig Start Date End Date Taking? Authorizing Provider  metFORMIN (GLUCOPHAGE) 500 MG tablet Take 500 mg by mouth 3 (three) times daily.    Yes Historical Provider, MD    Physical Exam: Vitals:   01/14/16 0030 01/14/16 0100 01/14/16 0130 01/14/16 0200  BP: 121/85 120/79 159/100 120/71  Pulse: 99 94 97 95  Resp: 18     Temp:      TempSrc:      SpO2: 94% 94% 96% 95%  Weight:      Height:          Constitutional: NAD, calm, comfortable Vitals:   01/14/16 0030 01/14/16 0100 01/14/16 0130 01/14/16 0200  BP: 121/85 120/79 159/100 120/71  Pulse: 99 94 97 95  Resp: 18  Temp:      TempSrc:      SpO2: 94% 94% 96% 95%  Weight:      Height:       Eyes: PERRL, lids and conjunctivae normal ENMT: Mucous membranes are moist. Posterior pharynx clear of any exudate or lesions.Normal dentition.  Neck: normal, supple, no masses, no thyromegaly Respiratory: clear to auscultation  bilaterally, no wheezing, no crackles. Normal respiratory effort. No accessory muscle use.  Cardiovascular: Regular rate and rhythm, no murmurs / rubs / gallops. Bilateral edema. 2+ pedal pulses. No carotid bruits.  Abdomen: no tenderness, no masses palpated. No hepatosplenomegaly. Bowel sounds positive.  Musculoskeletal: no clubbing / cyanosis. No joint deformity upper and lower extremities. Good ROM, no contractures. Normal muscle tone.  Skin: no rashes, lesions, ulcers. No induration Neurologic: CN 2-12 grossly intact. Sensation intact, DTR normal. Strength 5/5 in all 4.  Psychiatric: Normal judgment and insight. Alert and oriented x 3. Normal mood.     Labs on Admission: I have personally reviewed following labs and imaging studies  CBC:  Recent Labs Lab 01/13/16 2220  WBC 8.8  HGB 11.9*  HCT 35.8*  MCV 93.0  PLT 249   Basic Metabolic Panel:  Recent Labs Lab 01/13/16 2220  NA 124*  K 3.6  CL 87*  CO2 28  GLUCOSE 828*  BUN 24*  CREATININE 1.33*  CALCIUM 8.2*   GFR: Coagulation Profile:  Recent Labs Lab 01/13/16 2330  INR 0.91    CBG:  Recent Labs Lab 01/14/16 0036 01/14/16 0122  GLUCAP 484* 495*   Lipid Profile: Urine analysis:    Component Value Date/Time   COLORURINE STRAW (A) 01/13/2016 2245   APPEARANCEUR HAZY (A) 01/13/2016 2245   LABSPEC 1.017 01/13/2016 2245   PHURINE 6.0 01/13/2016 2245   GLUCOSEU >=500 (A) 01/13/2016 2245   HGBUR MODERATE (A) 01/13/2016 2245   BILIRUBINUR NEGATIVE 01/13/2016 2245   KETONESUR NEGATIVE 01/13/2016 2245   PROTEINUR 100 (A) 01/13/2016 2245   NITRITE NEGATIVE 01/13/2016 2245   LEUKOCYTESUR MODERATE (A) 01/13/2016 2245   ) Recent Results (from the past 240 hour(s))  Blood culture (routine x 2)     Status: None (Preliminary result)   Collection Time: 01/13/16 11:30 PM  Result Value Ref Range Status   Specimen Description LEFT ANTECUBITAL  Final   Special Requests BOTTLES DRAWN AEROBIC AND ANAEROBIC [facility]  EACH  Final   Culture PENDING  Incomplete   Report Status PENDING  Incomplete  Blood culture (routine x 2)     Status: None (Preliminary result)   Collection Time: 01/13/16 11:43 PM  Result Value Ref Range Status   Specimen Description BLOOD LEFT ARM  Final   Special Requests BOTTLES DRAWN AEROBIC AND ANAEROBIC 4CC EACH  Final   Culture PENDING  Incomplete   Report Status PENDING  Incomplete     Radiological Exams on Admission: Dg Chest 2 View  Result Date: 01/14/2016 CLINICAL DATA:  Visual changes low energy and leg swelling EXAM: CHEST  2 VIEW COMPARISON:  11/23/2014 FINDINGS: Lungs are mildly hyperinflated. Suspect right greater than left apical bulla. No acute infiltrate no consolidation or effusion. Normal cardiomediastinal silhouette. No pneumothorax. IMPRESSION: No radiographic evidence for acute cardiopulmonary abnormality Electronically Signed   By: Jasmine Pang M.D.   On: 01/14/2016 00:25   Ct Renal Stone Study  Result Date: 01/14/2016 CLINICAL DATA:  Weakness nausea and lower extremity swelling EXAM: CT ABDOMEN AND PELVIS WITHOUT CONTRAST TECHNIQUE: Multidetector CT imaging of the abdomen and pelvis  was performed following the standard protocol without IV contrast. COMPARISON:  12/20/2015, 09/14/2015 FINDINGS: Lower chest: Small foci of ground-glass density in the right lower lobe suggests mild inflammation or infection. No consolidation or effusion. Normal heart size. Coronary artery calcifications. Hepatobiliary: No focal hepatic abnormality. No biliary dilatation. No calcified gallstones. Tiny calcification along the surface of the right anterior inferior liver. Pancreas: Unremarkable. No pancreatic ductal dilatation or surrounding inflammatory changes. Spleen: Spleen upper normal in size.  No focal splenic abnormality Adrenals/Urinary Tract: Stable 2.3 cm left adrenal gland adenoma. Stable 3.4 cm right adrenal gland adenoma. Mild left hydronephrosis, slightly increased compared to  prior. No stone identified along the course of the left ureter. Punctate nonobstructing stone mid pole left kidney. Interval increase in right perinephric fluid collection/probable hematoma, now large. This measures approximately 9.1 cm transverse by 4.4 cm AP thick by 16.7 cm craniocaudad. Small amount of increased density is present in at the posterior inferior aspect of the collection. The right kidney is displaced anteriorly. Multiple nonobstructing stones within the right kidney. No right hydronephrosis. Mild asymmetric enlargement of the right. Hematoma is difficult to separate from the ileus psoas muscle on the right, this may be secondary to retroperitoneal hemorrhage or mass effect on the ileus psoas muscle. Stomach/Bowel: The stomach is nonenlarged. No dilated small bowel to suggest obstruction. Extensive stool throughout the colon. No colon wall thickening. Vascular/Lymphatic: Got kicked atherosclerotic vascular calcifications. Scattered subcentimeter mesenteric nodes. Reproductive: Prostate is unremarkable. Other: No free air or free fluid. 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 IMPRESSION: 1. Significant interval increase in size of a complex fluid right perinephric fluid collection, suspect for hematoma. This exerts mass effect on the right kidney which is displaced anteriorly. There is mass effect on the right iliopsoas muscle. A small amount of hemorrhage or fluid extends along or is within a portion of the right ileus psoas muscle which appears asymmetric compared to the left side. 2. Punctate stones are present in the right kidney. No hydronephrosis. 3. Mild left hydronephrosis. Punctate stone present in the mid left kidney. No ureteral stone identified on the left. 4. No evidence of a bowel obstruction. Large amount of stool is present throughout the colon 5. Stable bilateral adrenal gland adenomas. Electronically Signed   By: Jasmine PangKim  Fujinaga M.D.   On:  01/14/2016 00:23    EKG: Independently reviewed.   Assessment/Plan Principal Problem:   Hyperosmolar syndrome Active Problems:   Hyperglycemia   Hydronephrosis   Perinephric hematoma   UTI (urinary tract infection)    PLAN:   Hyperosmolar syndrome:  Will continue with IVF and cover hyperglycemia with SSI.    Perinephric hematoma:  Felt to be spontaneous.  Will use DVT proph with SCD.  The hematoma was enlarging, but his HB is OK.  Type and screen.  Consider urology consultation.     UTI:  Will continue with IV Rocephin.     DVT prophylaxis: SCD.  Code Status: FULL CODE.  Family Communication: None.  Disposition Plan: To home when appropriate.  Consults called: Urology by EDP.   Admission status: OBS.    Issaih Kaus MD FACP. [facility]  If 7PM-7AM, please contact night-coverage [email or web address] Password [email or web address]  01/14/2016, 2:02 AM

## 2016-01-15 ENCOUNTER — Inpatient Hospital Stay (HOSPITAL_COMMUNITY): Payer: BLUE CROSS/BLUE SHIELD

## 2016-01-15 DIAGNOSIS — N1330 Unspecified hydronephrosis: Secondary | ICD-10-CM

## 2016-01-15 DIAGNOSIS — N390 Urinary tract infection, site not specified: Secondary | ICD-10-CM

## 2016-01-15 DIAGNOSIS — S37019A Minor contusion of unspecified kidney, initial encounter: Secondary | ICD-10-CM

## 2016-01-15 DIAGNOSIS — E119 Type 2 diabetes mellitus without complications: Secondary | ICD-10-CM

## 2016-01-15 DIAGNOSIS — I70209 Unspecified atherosclerosis of native arteries of extremities, unspecified extremity: Secondary | ICD-10-CM | POA: Diagnosis present

## 2016-01-15 DIAGNOSIS — I10 Essential (primary) hypertension: Secondary | ICD-10-CM | POA: Diagnosis present

## 2016-01-15 DIAGNOSIS — R7881 Bacteremia: Secondary | ICD-10-CM

## 2016-01-15 DIAGNOSIS — Z87442 Personal history of urinary calculi: Secondary | ICD-10-CM | POA: Diagnosis present

## 2016-01-15 DIAGNOSIS — D649 Anemia, unspecified: Secondary | ICD-10-CM | POA: Diagnosis present

## 2016-01-15 LAB — CBC
HCT: 31 % — ABNORMAL LOW (ref 39.0–52.0)
HEMOGLOBIN: 10.3 g/dL — AB (ref 13.0–17.0)
MCH: 31 pg (ref 26.0–34.0)
MCHC: 33.2 g/dL (ref 30.0–36.0)
MCV: 93.4 fL (ref 78.0–100.0)
PLATELETS: 242 10*3/uL (ref 150–400)
RBC: 3.32 MIL/uL — AB (ref 4.22–5.81)
RDW: 14.5 % (ref 11.5–15.5)
WBC: 8.9 10*3/uL (ref 4.0–10.5)

## 2016-01-15 LAB — BASIC METABOLIC PANEL
Anion gap: 4 — ABNORMAL LOW (ref 5–15)
BUN: 11 mg/dL (ref 6–20)
CHLORIDE: 104 mmol/L (ref 101–111)
CO2: 27 mmol/L (ref 22–32)
Calcium: 7.1 mg/dL — ABNORMAL LOW (ref 8.9–10.3)
Creatinine, Ser: 0.67 mg/dL (ref 0.61–1.24)
Glucose, Bld: 62 mg/dL — ABNORMAL LOW (ref 65–99)
POTASSIUM: 3.2 mmol/L — AB (ref 3.5–5.1)
SODIUM: 135 mmol/L (ref 135–145)

## 2016-01-15 LAB — GLUCOSE, CAPILLARY
GLUCOSE-CAPILLARY: 196 mg/dL — AB (ref 65–99)
GLUCOSE-CAPILLARY: 157 mg/dL — AB (ref 65–99)
Glucose-Capillary: 125 mg/dL — ABNORMAL HIGH (ref 65–99)
Glucose-Capillary: 237 mg/dL — ABNORMAL HIGH (ref 65–99)
Glucose-Capillary: 76 mg/dL (ref 65–99)
Glucose-Capillary: 84 mg/dL (ref 65–99)

## 2016-01-15 LAB — HEMOGLOBIN AND HEMATOCRIT, BLOOD
HCT: 29.7 % — ABNORMAL LOW (ref 39.0–52.0)
HEMOGLOBIN: 10.1 g/dL — AB (ref 13.0–17.0)

## 2016-01-15 LAB — MAGNESIUM: MAGNESIUM: 1.6 mg/dL — AB (ref 1.7–2.4)

## 2016-01-15 LAB — MRSA PCR SCREENING: MRSA BY PCR: NEGATIVE

## 2016-01-15 MED ORDER — INSULIN GLARGINE 100 UNIT/ML ~~LOC~~ SOLN
12.0000 [IU] | SUBCUTANEOUS | Status: DC
  Administered 2016-01-16: 12 [IU] via SUBCUTANEOUS
  Filled 2016-01-15 (×2): qty 0.12

## 2016-01-15 MED ORDER — MAGNESIUM SULFATE 2 GM/50ML IV SOLN
2.0000 g | Freq: Once | INTRAVENOUS | Status: AC
  Administered 2016-01-15: 2 g via INTRAVENOUS
  Filled 2016-01-15: qty 50

## 2016-01-15 MED ORDER — INSULIN GLARGINE 100 UNIT/ML ~~LOC~~ SOLN
5.0000 [IU] | Freq: Once | SUBCUTANEOUS | Status: AC
  Administered 2016-01-15: 5 [IU] via SUBCUTANEOUS
  Filled 2016-01-15: qty 0.05

## 2016-01-15 MED ORDER — INSULIN GLARGINE 100 UNIT/ML ~~LOC~~ SOLN
7.0000 [IU] | Freq: Once | SUBCUTANEOUS | Status: AC
  Administered 2016-01-15: 7 [IU] via SUBCUTANEOUS
  Filled 2016-01-15: qty 0.07

## 2016-01-15 MED ORDER — CEFAZOLIN SODIUM-DEXTROSE 2-4 GM/100ML-% IV SOLN
2.0000 g | Freq: Three times a day (TID) | INTRAVENOUS | Status: DC
  Administered 2016-01-15 – 2016-01-20 (×16): 2 g via INTRAVENOUS
  Filled 2016-01-15 (×26): qty 100

## 2016-01-15 MED ORDER — POTASSIUM CHLORIDE IN NACL 40-0.9 MEQ/L-% IV SOLN
INTRAVENOUS | Status: DC
  Administered 2016-01-15 – 2016-01-16 (×2): 75 mL/h via INTRAVENOUS

## 2016-01-15 MED ORDER — INSULIN ASPART 100 UNIT/ML ~~LOC~~ SOLN
0.0000 [IU] | Freq: Every day | SUBCUTANEOUS | Status: DC
  Administered 2016-01-15: 2 [IU] via SUBCUTANEOUS

## 2016-01-15 MED ORDER — DEXTROSE 5 % IV SOLN
INTRAVENOUS | Status: AC
  Filled 2016-01-15: qty 10

## 2016-01-15 MED ORDER — POLYVINYL ALCOHOL 1.4 % OP SOLN
1.0000 [drp] | OPHTHALMIC | Status: DC | PRN
Start: 2016-01-15 — End: 2016-01-20
  Administered 2016-01-15 – 2016-01-16 (×2): 1 [drp] via OPHTHALMIC
  Filled 2016-01-15: qty 15

## 2016-01-15 MED ORDER — INSULIN ASPART 100 UNIT/ML ~~LOC~~ SOLN
4.0000 [IU] | Freq: Three times a day (TID) | SUBCUTANEOUS | Status: DC
  Administered 2016-01-15 – 2016-01-16 (×4): 4 [IU] via SUBCUTANEOUS

## 2016-01-15 MED ORDER — INSULIN ASPART 100 UNIT/ML ~~LOC~~ SOLN
3.0000 [IU] | Freq: Three times a day (TID) | SUBCUTANEOUS | Status: DC
  Administered 2016-01-15: 3 [IU] via SUBCUTANEOUS

## 2016-01-15 MED ORDER — INSULIN ASPART 100 UNIT/ML ~~LOC~~ SOLN
0.0000 [IU] | Freq: Three times a day (TID) | SUBCUTANEOUS | Status: DC
  Administered 2016-01-15 (×2): 2 [IU] via SUBCUTANEOUS
  Administered 2016-01-16: 5 [IU] via SUBCUTANEOUS

## 2016-01-15 MED ORDER — POTASSIUM CHLORIDE CRYS ER 20 MEQ PO TBCR
40.0000 meq | EXTENDED_RELEASE_TABLET | Freq: Once | ORAL | Status: AC
  Administered 2016-01-15: 40 meq via ORAL
  Filled 2016-01-15: qty 2

## 2016-01-15 NOTE — Progress Notes (Signed)
PROGRESS NOTE    Allen Mooney  NAT:[id number]  DOB: [birth year]  DOA: 01/13/2016 PCP: Neale Burly, MD Outpatient Specialists:   Hospital course: Allen Mooney is an 46 y.o. male with hx of DM, HTN, known nephrolithiasis, recent Dx of perinephric hematoma, presented to the ER complaining of feeling weak, increased thrist and polyuria.  He has no fever, chills, nausea or vomiting.  He was evaluated and found to have a BS of 800's with concomitant pseudohyponatremia of 124, Hb of 11 g per dL, and an abdominal pelvic CT showed increasing size of the pernephric hematoma with resulting shift of the right kidney.  He also found to have a small kidney stone causing mild left hydronephrosis.  He was given IVF and SQ insulin, and his CBG did drop to 400's, prior to rising to 500's.  His UA was positive and he was started on IV Rocephin.  EDP spoke with urologist and hospitalist was asked to admit him for hyperosmolar syndrome, along with enlarging perinephric hematoma, and UTI with neprholithiasis and mild hydronephrosis.    Assessment & Plan:   1. Uncontrolled diabetes mellitus insulin requiring - Pt will need to be discharged on basal bolus insulin, education in process now, education kit ordered, pt prefers insulin pen, given lantus this morning 7 units, will order daily lantus 10 units, plus novolog 3 units TIDAC plus sliding scale coverage.  2. Hyperosmolar nonketotic syndrome - Pt was sent to SDU on 1/6 and started on insulin drip, blood sugars controlled now and started on lantus and novolog.  Gentle IVFs and potassium replacement ordered.  Can go to telemetry today.   3. Large perinephric hematoma - Monitored hemoglobin and it has been stable at 10.  Urology has been consulted.   4. Staph aureus bacteremia - concerning that 1/2 BC has been positive, same bug that was present in urine culture from September, likely this was never fully treated, will start cefazolin 2 gm IV every 8 h, urine culture  pending from 1/5.  Repeat blood cultures today, likely will need PICC line and 6 weeks of IV antibiotics.  Will order echocardiogram.   5. UTI - presumed staph aureus - treating as above.     DVT prophylaxis: SCD no anticoagulation because of hematoma Code Status: FULL  Family Communication: bedside Disposition Plan: TBD   Consultants:  urology  Subjective: Pt says he feels much better today.    Objective: Vitals:   01/15/16 0600 01/15/16 0700 01/15/16 0741 01/15/16 0800  BP: 126/87 (!) 126/99    Pulse: 96 (!) 105 96 94  Resp: '14 13 11   '$ Temp:   98.6 F (37 C)   TempSrc:   Oral   SpO2: 93% 97% 97% 96%  Weight:      Height:        Intake/Output Summary (Last 24 hours) at 01/15/16 0814 Last data filed at 01/15/16 0448  Gross per 24 hour  Intake           1308.9 ml  Output             4100 ml  Net          -2791.1 ml   Filed Weights   01/13/16 1935 01/14/16 0307 01/15/16 0448  Weight: 65.8 kg (145 lb) 64.4 kg (141 lb 15.6 oz) 61.4 kg (135 lb 5.8 oz)    Exam:  General exam: emaciated, chronically ill appearing male, appears much older than stated age.  Respiratory system: Clear.  No increased work of breathing. Cardiovascular system: S1 & S2 heard.  Gastrointestinal system: Abdomen is nondistended, soft, mild right flank pain. Normal bowel sounds heard. Central nervous system: Alert and oriented. No focal neurological deficits. Extremities: no CCE.  Data Reviewed: Basic Metabolic Panel:  Recent Labs Lab 01/13/16 2220 01/14/16 1100 01/15/16 0446  NA 124* 133* 135  K 3.6 3.4* 3.2*  CL 87* 100* 104  CO2 '28 27 27  '$ GLUCOSE 828* 397* 62*  BUN 24* 16 11  CREATININE 1.33* 0.91 0.67  CALCIUM 8.2* 7.4* 7.1*  MG  --   --  1.6*   Liver Function Tests: No results for input(s): AST, ALT, ALKPHOS, BILITOT, PROT, ALBUMIN in the last 168 hours. No results for input(s): LIPASE, AMYLASE in the last 168 hours. No results for input(s): AMMONIA in the last 168  hours. CBC:  Recent Labs Lab 01/13/16 2220 01/14/16 0347 01/14/16 1100 01/14/16 2137 01/15/16 0225 01/15/16 0446  WBC 8.8 9.5 9.3  --   --  8.9  HGB 11.9* 11.6* 10.5* 10.3* 10.1* 10.3*  HCT 35.8* 34.5* 31.3* 30.9* 29.7* 31.0*  MCV 93.0 92.2 92.6  --   --  93.4  PLT 249 270 216  --   --  242   Cardiac Enzymes: No results for input(s): CKTOTAL, CKMB, CKMBINDEX, TROPONINI in the last 168 hours. CBG (last 3)   Recent Labs  01/15/16 0055 01/15/16 0156 01/15/16 0740  GLUCAP 84 125* 76   Recent Results (from the past 240 hour(s))  Blood culture (routine x 2)     Status: None (Preliminary result)   Collection Time: 01/13/16 11:30 PM  Result Value Ref Range Status   Specimen Description LEFT ANTECUBITAL  Final   Special Requests BOTTLES DRAWN AEROBIC AND ANAEROBIC [facility] EACH  Final   Culture  Setup Time   Final    GRAM POSITIVE COCCI Gram Stain Report Called to,Read Back By and Verified With: DAVIS,L. AT 0254 ON 01/14/2016 BY EVA IN BOTH AEROBIC AND ANAEROBIC BOTTLES Performed at [place], READ BACK BY AND VERIFIED WITH: J HERN,RN AT 2053 01/14/16 BY L BENFIELD Performed at [place]  Final   Report Status PENDING  Incomplete  Blood Culture ID Panel (Reflexed)     Status: Abnormal   Collection Time: 01/13/16 11:30 PM  Result Value Ref Range Status   Enterococcus species NOT DETECTED NOT DETECTED Final   Listeria monocytogenes NOT DETECTED NOT DETECTED Final   Staphylococcus species DETECTED (A) NOT DETECTED Final    Comment: CRITICAL RESULT CALLED TO, READ BACK BY AND VERIFIED WITH: J HERN,RN AT 2053 01/14/16 BY L BENFIELD    Staphylococcus aureus DETECTED (A) NOT DETECTED Final    Comment: CRITICAL RESULT CALLED TO, READ BACK BY AND VERIFIED WITH: J HERN,RN AT 2053 01/14/16 BY L BENFIELD    Methicillin resistance NOT DETECTED NOT DETECTED Final   Streptococcus species NOT DETECTED NOT DETECTED Final    Streptococcus agalactiae NOT DETECTED NOT DETECTED Final   Streptococcus pneumoniae NOT DETECTED NOT DETECTED Final   Streptococcus pyogenes NOT DETECTED NOT DETECTED Final   Acinetobacter baumannii NOT DETECTED NOT DETECTED Final   Enterobacteriaceae species NOT DETECTED NOT DETECTED Final   Enterobacter cloacae complex NOT DETECTED NOT DETECTED Final   Escherichia coli NOT DETECTED NOT DETECTED Final   Klebsiella oxytoca NOT DETECTED NOT DETECTED Final   Klebsiella pneumoniae NOT DETECTED NOT DETECTED Final  Proteus species NOT DETECTED NOT DETECTED Final   Serratia marcescens NOT DETECTED NOT DETECTED Final   Haemophilus influenzae NOT DETECTED NOT DETECTED Final   Neisseria meningitidis NOT DETECTED NOT DETECTED Final   Pseudomonas aeruginosa NOT DETECTED NOT DETECTED Final   Candida albicans NOT DETECTED NOT DETECTED Final   Candida glabrata NOT DETECTED NOT DETECTED Final   Candida krusei NOT DETECTED NOT DETECTED Final   Candida parapsilosis NOT DETECTED NOT DETECTED Final   Candida tropicalis NOT DETECTED NOT DETECTED Final    Comment: Performed at [facility]  Blood culture (routine x 2)     Status: None (Preliminary result)   Collection Time: 01/13/16 11:43 PM  Result Value Ref Range Status   Specimen Description BLOOD LEFT ARM  Final   Special Requests BOTTLES DRAWN AEROBIC AND ANAEROBIC 4CC EACH  Final   Culture  Setup Time   Final    GRAM POSITIVE COCCI Gram Stain Report Called to,Read Back By and Verified With: DAVIS,L AT 1602 ON 01/14/2016 BY EVA IN BOTH AEROBIC AND ANAEROBIC BOTTLES Performed at [place], READ BACK BY AND VERIFIED WITH: J HERN,RN AT 2053 01/14/16 BY L BENFIELD Performed at [place]  Final   Report Status PENDING  Incomplete  MRSA PCR Screening     Status: None   Collection Time: 01/14/16  6:08 PM  Result Value Ref Range Status   MRSA by PCR NEGATIVE NEGATIVE  Final    Comment:        The GeneXpert MRSA Assay (FDA approved for NASAL specimens only), is one component of a comprehensive MRSA colonization surveillance program. It is not intended to diagnose MRSA infection nor to guide or monitor treatment for MRSA infections.      Studies: Dg Chest 2 View  Result Date: 01/14/2016 CLINICAL DATA:  Visual changes low energy and leg swelling EXAM: CHEST  2 VIEW COMPARISON:  11/23/2014 FINDINGS: Lungs are mildly hyperinflated. Suspect right greater than left apical bulla. No acute infiltrate no consolidation or effusion. Normal cardiomediastinal silhouette. No pneumothorax. IMPRESSION: No radiographic evidence for acute cardiopulmonary abnormality Electronically Signed   By: Donavan Foil M.D.   On: 01/14/2016 00:25   Ct Renal Stone Study  Result Date: 01/14/2016 CLINICAL DATA:  Weakness nausea and lower extremity swelling EXAM: CT ABDOMEN AND PELVIS WITHOUT CONTRAST TECHNIQUE: Multidetector CT imaging of the abdomen and pelvis was performed following the standard protocol without IV contrast. COMPARISON:  12/20/2015, 09/14/2015 FINDINGS: Lower chest: Small foci of ground-glass density in the right lower lobe suggests mild inflammation or infection. No consolidation or effusion. Normal heart size. Coronary artery calcifications. Hepatobiliary: No focal hepatic abnormality. No biliary dilatation. No calcified gallstones. Tiny calcification along the surface of the right anterior inferior liver. Pancreas: Unremarkable. No pancreatic ductal dilatation or surrounding inflammatory changes. Spleen: Spleen upper normal in size.  No focal splenic abnormality Adrenals/Urinary Tract: Stable 2.3 cm left adrenal gland adenoma. Stable 3.4 cm right adrenal gland adenoma. Mild left hydronephrosis, slightly increased compared to prior. No stone identified along the course of the left ureter. Punctate nonobstructing stone mid pole left kidney. Interval increase in right  perinephric fluid collection/probable hematoma, now large. This measures approximately 9.1 cm transverse by 4.4 cm AP thick by 16.7 cm craniocaudad. Small amount of increased density is present in at the posterior inferior aspect of the collection. The right kidney is displaced anteriorly. Multiple nonobstructing stones within the  right kidney. No right hydronephrosis. Mild asymmetric enlargement of the right. Hematoma is difficult to separate from the ileus psoas muscle on the right, this may be secondary to retroperitoneal hemorrhage or mass effect on the ileus psoas muscle. Stomach/Bowel: The stomach is nonenlarged. No dilated small bowel to suggest obstruction. Extensive stool throughout the colon. No colon wall thickening. Vascular/Lymphatic: Got kicked atherosclerotic vascular calcifications. Scattered subcentimeter mesenteric nodes. Reproductive: Prostate is unremarkable. Other: No free air or free fluid. 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 IMPRESSION: 1. Significant interval increase in size of a complex fluid right perinephric fluid collection, suspect for hematoma. This exerts mass effect on the right kidney which is displaced anteriorly. There is mass effect on the right iliopsoas muscle. A small amount of hemorrhage or fluid extends along or is within a portion of the right ileus psoas muscle which appears asymmetric compared to the left side. 2. Punctate stones are present in the right kidney. No hydronephrosis. 3. Mild left hydronephrosis. Punctate stone present in the mid left kidney. No ureteral stone identified on the left. 4. No evidence of a bowel obstruction. Large amount of stool is present throughout the colon 5. Stable bilateral adrenal gland adenomas. Electronically Signed   By: Donavan Foil M.D.   On: 01/14/2016 00:23     Scheduled Meds: .  ceFAZolin (ANCEF) IV  2 g Intravenous Q8H  . insulin aspart  0-5 Units Subcutaneous QHS  . insulin  aspart  0-9 Units Subcutaneous TID WC  . insulin aspart  3 Units Subcutaneous TID WC  . magnesium sulfate 1 - 4 g bolus IVPB  2 g Intravenous Once  . polyethylene glycol  17 g Oral BID  . potassium chloride  40 mEq Oral Once  . senna-docusate  2 tablet Oral BID  . sodium chloride  1,000 mL Intravenous Once   Continuous Infusions: . 0.9 % NaCl with KCl 40 mEq / L      Principal Problem:   Hyperosmolar syndrome Active Problems:   Hyperglycemia   Hydronephrosis   Perinephric hematoma   UTI (urinary tract infection)   Tobacco abuse   Critical Care Time spent: 35 mins  Irwin Brakeman, MD, FAAFP [facility] Pager [phone]  If 7PM-7AM, please contact night-coverage [email or web address] Password [email or web address] 01/15/2016, 8:14 AM    LOS: 1 day

## 2016-01-15 NOTE — Consult Note (Signed)
[facility]    Date of Admission:  01/13/2016           Day 2 antibiotics        Day 1 cefazolin       Reason for Consult: Automatic consultation for staph aureus bacteremia     Principal Problem:   Staphylococcus aureus bacteremia Active Problems:   Perinephric fluid collection   UTI (urinary tract infection)   Hyperglycemia   Hyperosmolar syndrome   Hydronephrosis   Tobacco abuse   Diabetes mellitus (HCC)   Hypertension   Atherosclerotic peripheral vascular disease (HCC)   Normocytic anemia   History of nephrolithiasis   .  ceFAZolin (ANCEF) IV  2 g Intravenous Q8H  . insulin aspart  0-5 Units Subcutaneous QHS  . insulin aspart  0-9 Units Subcutaneous TID WC  . insulin aspart  4 Units Subcutaneous TID WC  . [START ON 01/16/2016] insulin glargine  12 Units Subcutaneous BH-q7a  . polyethylene glycol  17 g Oral BID  . senna-docusate  2 tablet Oral BID  . sodium chloride  1,000 mL Intravenous Once    Recommendations: 1. Agree with cefazolin 2. Repeat blood cultures 3. Await results of transthoracic echocardiogram 4. Urology consultation for consideration of percutaneous drain 5. Hold off on PICC placement until blood cultures are negative   Assessment: [name] has staph aureus bacteremia, urinary tract infection and probably has a perinephric abscess. I agree with IV cefazolin, repeat blood cultures and transthoracic echocardiogram. I will hold off on PICC placement until we know blood cultures are negative. I would strongly consider drainage of the perinephric collection. If his transthoracic echocardiogram does not reveal any evidence of endocarditis I would proceed ahead with transesophageal echocardiogram.    HPI: Allen Mooney is a 46 y.o. male with diabetes who was seen in the emergency department last September with recent onset of right flank pain and hematuria. CT scan revealed a 2 mm obstructing right ureteral stone and mild  hydronephrosis. Urine culture revealed methicillin sensitive staph aureus. He was discharged home on oral ciprofloxacin which is isolate was sensitive to. He came back 2 days ago complaining of weakness, urinary frequency and increased thirst. He was found to have hyperglycemia. Urinalysis revealed too numerous to count white blood cells. Repeat CT scan revealed a complex right perinephric fluid collection. Both admission blood cultures have grown MSSA.   Review of Systems: Review of Systems  Unable to perform ROS: Other  Constitutional:       This is a remote consultation so no review of systems was obtained.    Past Medical History:  Diagnosis Date  . Diabetes mellitus without complication (HCC)   . Kidney stones   . Perinephric hematoma     Social History  Substance Use Topics  . Smoking status: Current Every Day Smoker    Packs/day: 1.00    Types: Cigarettes  . Smokeless tobacco: Never Used  . Alcohol use No    History reviewed. No pertinent family history. No Known Allergies  OBJECTIVE: Blood pressure 108/65, pulse 86, temperature 97.9 F (36.6 C), temperature source Oral, resp. rate 17, height 5\' 11"  (1.803 m), weight 135 lb 5.8 oz (61.4 kg), SpO2 99 %.  Physical Exam  Constitutional:  This is a remote consultation so no physical exam was obtained.    Lab Results Lab Results  Component Value Date   WBC 8.9 01/15/2016   HGB 10.3 (L)  01/15/2016   HCT 31.0 (L) 01/15/2016   MCV 93.4 01/15/2016   PLT 242 01/15/2016    Lab Results  Component Value Date   CREATININE 0.67 01/15/2016   BUN 11 01/15/2016   NA 135 01/15/2016   K 3.2 (L) 01/15/2016   CL 104 01/15/2016   CO2 27 01/15/2016   No results found for: ALT, AST, GGT, ALKPHOS, BILITOT   Microbiology: Recent Results (from the past 240 hour(s))  Blood culture (routine x 2)     Status: Abnormal (Preliminary result)   Collection Time: 01/13/16 11:30 PM  Result Value Ref Range Status   Specimen Description  LEFT ANTECUBITAL  Final   Special Requests BOTTLES DRAWN AEROBIC AND ANAEROBIC [facility] EACH  Final   Culture  Setup Time   Final    GRAM POSITIVE COCCI Gram Stain Report Called to,Read Back By and Verified With: DAVIS,L. AT 1514 ON 01/14/2016 BY EVA IN BOTH AEROBIC AND ANAEROBIC BOTTLES Performed at [facility] CRITICAL RESULT CALLED TO, READ BACK BY AND VERIFIED WITH: J HERN,RN AT 2053 01/14/16 BY L BENFIELD [facility]    Culture STAPHYLOCOCCUS AUREUS (A)  Final   Report Status PENDING  Incomplete  Blood Culture ID Panel (Reflexed)     Status: Abnormal   Collection Time: 01/13/16 11:30 PM  Result Value Ref Range Status   Enterococcus species NOT DETECTED NOT DETECTED Final   Listeria monocytogenes NOT DETECTED NOT DETECTED Final   Staphylococcus species DETECTED (A) NOT DETECTED Final    Comment: CRITICAL RESULT CALLED TO, READ BACK BY AND VERIFIED WITH: J HERN,RN AT 2053 01/14/16 BY L BENFIELD    Staphylococcus aureus DETECTED (A) NOT DETECTED Final    Comment: CRITICAL RESULT CALLED TO, READ BACK BY AND VERIFIED WITH: J HERN,RN AT 2053 01/14/16 BY L BENFIELD    Methicillin resistance NOT DETECTED NOT DETECTED Final   Streptococcus species NOT DETECTED NOT DETECTED Final   Streptococcus agalactiae NOT DETECTED NOT DETECTED Final   Streptococcus pneumoniae NOT DETECTED NOT DETECTED Final   Streptococcus pyogenes NOT DETECTED NOT DETECTED Final   Acinetobacter baumannii NOT DETECTED NOT DETECTED Final   Enterobacteriaceae species NOT DETECTED NOT DETECTED Final   Enterobacter cloacae complex NOT DETECTED NOT DETECTED Final   Escherichia coli NOT DETECTED NOT DETECTED Final   Klebsiella oxytoca NOT DETECTED NOT DETECTED Final   Klebsiella pneumoniae NOT DETECTED NOT DETECTED Final   Proteus species NOT DETECTED NOT DETECTED Final   Serratia marcescens NOT DETECTED NOT DETECTED Final   Haemophilus influenzae NOT DETECTED NOT DETECTED Final   Neisseria  meningitidis NOT DETECTED NOT DETECTED Final   Pseudomonas aeruginosa NOT DETECTED NOT DETECTED Final   Candida albicans NOT DETECTED NOT DETECTED Final   Candida glabrata NOT DETECTED NOT DETECTED Final   Candida krusei NOT DETECTED NOT DETECTED Final   Candida parapsilosis NOT DETECTED NOT DETECTED Final   Candida tropicalis NOT DETECTED NOT DETECTED Final    Comment: [facility]formed [facility]  Blood culture (routine x 2)     Status: Abnormal (Preliminary result)   Collection Time: 01/13/16 11:43 PM  Result Value Ref Range Status   Specimen Description BLOOD LEFT ARM  Final   Special Requests BOTTLES DRAWN AEROBIC AND ANAEROBIC 4CC EACH  Final   Culture  Setup Time   Final    GRAM POSITIVE COCCI Gram Stain Report Called to,Read Back By and Verified With: DAVIS,L AT 1602 ON 01/14/2016 BY EVA IN BOTH AEROBIC  AND ANAEROBIC BOTTLES Performed at [facility] CRITICAL RESULT CALLED TO, READ BACK BY AND VERIFIED WITH: J HERN,RN AT 2053 01/14/16 BY L BENFIELD Performed at [facility]    Culture STAPHYLOCOCCUS AUREUS (A)  Final   Report Status PENDING  Incomplete  MRSA PCR Screening     Status: None   Collection Time: 01/14/16  6:08 PM  Result Value Ref Range Status   MRSA by PCR NEGATIVE NEGATIVE Final    Comment:        The GeneXpert MRSA Assay (FDA approved for NASAL specimens only), is one component of a comprehensive MRSA colonization surveillance program. It is not intended to diagnose MRSA infection nor to guide or monitor treatment for MRSA infections.     Cliffton Asters, MD [facility] [phone] pager   [phone] cell 01/15/2016, 11:33 AM

## 2016-01-15 NOTE — Progress Notes (Signed)
PT TRANSFERRING TO MED/WSURG ON 300. AL;ERT AND ORIENTED. SKIN WARM AND DRY. IV;S X2 PATENT. REPORT CALLED TO BECKY RN ON 300.

## 2016-01-15 NOTE — Progress Notes (Signed)
*  PRELIMINARY RESULTS* Echocardiogram 2D Echocardiogram has been performed.  Stacey DrainWhite, Chianne Byrns J 01/15/2016, 11:22 AM

## 2016-01-16 DIAGNOSIS — I10 Essential (primary) hypertension: Secondary | ICD-10-CM

## 2016-01-16 LAB — MAGNESIUM: Magnesium: 1.9 mg/dL (ref 1.7–2.4)

## 2016-01-16 LAB — ECHOCARDIOGRAM COMPLETE
HEIGHTINCHES: 71 in
WEIGHTICAEL: 2165.8 [oz_av]

## 2016-01-16 LAB — GLUCOSE, CAPILLARY
GLUCOSE-CAPILLARY: 298 mg/dL — AB (ref 65–99)
Glucose-Capillary: 201 mg/dL — ABNORMAL HIGH (ref 65–99)
Glucose-Capillary: 283 mg/dL — ABNORMAL HIGH (ref 65–99)
Glucose-Capillary: 362 mg/dL — ABNORMAL HIGH (ref 65–99)
Glucose-Capillary: 222 mg/dL — ABNORMAL HIGH (ref 65–99)

## 2016-01-16 LAB — BASIC METABOLIC PANEL
Anion gap: 4 — ABNORMAL LOW (ref 5–15)
BUN: 12 mg/dL (ref 6–20)
CALCIUM: 7.2 mg/dL — AB (ref 8.9–10.3)
CO2: 26 mmol/L (ref 22–32)
CREATININE: 0.74 mg/dL (ref 0.61–1.24)
Chloride: 105 mmol/L (ref 101–111)
GFR calc Af Amer: >60 mL/min (ref >60)
GLUCOSE: 298 mg/dL — AB (ref 65–99)
Potassium: 4.1 mmol/L (ref 3.5–5.1)
Sodium: 135 mmol/L (ref 135–145)

## 2016-01-16 LAB — CBC
HCT: 31.4 % — ABNORMAL LOW (ref 39.0–52.0)
Hemoglobin: 9.9 g/dL — ABNORMAL LOW (ref 13.0–17.0)
MCH: 29.8 pg (ref 26.0–34.0)
MCHC: 31.5 g/dL (ref 30.0–36.0)
MCV: 94.6 fL (ref 78.0–100.0)
PLATELETS: 227 10*3/uL (ref 150–400)
RBC: 3.32 MIL/uL — ABNORMAL LOW (ref 4.22–5.81)
RDW: 14.5 % (ref 11.5–15.5)
WBC: 7.9 10*3/uL (ref 4.0–10.5)

## 2016-01-16 LAB — HEMOGLOBIN A1C
Hgb A1c MFr Bld: >15.5 % — ABNORMAL HIGH (ref 4.8–5.6)
Mean Plasma Glucose: >398 mg/dL

## 2016-01-16 LAB — CULTURE, BLOOD (ROUTINE X 2)

## 2016-01-16 LAB — C-PEPTIDE: C-Peptide: 2.8 ng/mL (ref 1.1–4.4)

## 2016-01-16 MED ORDER — INSULIN ASPART 100 UNIT/ML ~~LOC~~ SOLN
0.0000 [IU] | Freq: Every day | SUBCUTANEOUS | Status: DC
  Administered 2016-01-16: 2 [IU] via SUBCUTANEOUS
  Administered 2016-01-18: 5 [IU] via SUBCUTANEOUS
  Administered 2016-01-19: 3 [IU] via SUBCUTANEOUS

## 2016-01-16 MED ORDER — INSULIN GLARGINE 100 UNIT/ML ~~LOC~~ SOLN
16.0000 [IU] | SUBCUTANEOUS | Status: DC
  Filled 2016-01-16: qty 0.16

## 2016-01-16 MED ORDER — INSULIN GLARGINE 100 UNIT/ML ~~LOC~~ SOLN
8.0000 [IU] | Freq: Once | SUBCUTANEOUS | Status: AC
  Administered 2016-01-16: 8 [IU] via SUBCUTANEOUS
  Filled 2016-01-16: qty 0.08

## 2016-01-16 MED ORDER — INSULIN STARTER KIT- PEN NEEDLES (ENGLISH)
1.0000 | Freq: Once | Status: AC
  Administered 2016-01-16: 1
  Filled 2016-01-16: qty 1

## 2016-01-16 MED ORDER — INSULIN GLARGINE 100 UNIT/ML ~~LOC~~ SOLN
20.0000 [IU] | SUBCUTANEOUS | Status: DC
  Administered 2016-01-17: 20 [IU] via SUBCUTANEOUS
  Filled 2016-01-16 (×2): qty 0.2

## 2016-01-16 MED ORDER — POLYETHYLENE GLYCOL 3350 17 G PO PACK
34.0000 g | PACK | Freq: Two times a day (BID) | ORAL | Status: DC
  Administered 2016-01-16 – 2016-01-18 (×4): 34 g via ORAL
  Administered 2016-01-19: 17 g via ORAL
  Administered 2016-01-19 – 2016-01-20 (×2): 34 g via ORAL
  Filled 2016-01-16 (×7): qty 2

## 2016-01-16 MED ORDER — METOCLOPRAMIDE HCL 10 MG PO TABS
10.0000 mg | ORAL_TABLET | Freq: Three times a day (TID) | ORAL | Status: AC
  Administered 2016-01-16 – 2016-01-17 (×4): 10 mg via ORAL
  Filled 2016-01-16 (×4): qty 1

## 2016-01-16 MED ORDER — INSULIN ASPART 100 UNIT/ML ~~LOC~~ SOLN
0.0000 [IU] | Freq: Three times a day (TID) | SUBCUTANEOUS | Status: DC
  Administered 2016-01-16: 8 [IU] via SUBCUTANEOUS
  Administered 2016-01-16 – 2016-01-17 (×2): 15 [IU] via SUBCUTANEOUS
  Administered 2016-01-17: 3 [IU] via SUBCUTANEOUS
  Administered 2016-01-17: 8 [IU] via SUBCUTANEOUS
  Administered 2016-01-18: 3 [IU] via SUBCUTANEOUS
  Administered 2016-01-19: 5 [IU] via SUBCUTANEOUS
  Administered 2016-01-19 (×2): 3 [IU] via SUBCUTANEOUS
  Administered 2016-01-20: 5 [IU] via SUBCUTANEOUS

## 2016-01-16 MED ORDER — LIVING WELL WITH DIABETES BOOK
Freq: Once | Status: AC
  Administered 2016-01-16: 12:00:00
  Filled 2016-01-16: qty 1

## 2016-01-16 MED ORDER — HYDROCODONE-ACETAMINOPHEN 5-325 MG PO TABS
1.0000 | ORAL_TABLET | Freq: Four times a day (QID) | ORAL | Status: AC | PRN
  Administered 2016-01-16 – 2016-01-17 (×6): 1 via ORAL
  Filled 2016-01-16 (×6): qty 1

## 2016-01-16 MED ORDER — INSULIN ASPART 100 UNIT/ML ~~LOC~~ SOLN: 5.0000 [IU] | Freq: Three times a day (TID) | SUBCUTANEOUS | Status: DC

## 2016-01-16 MED ORDER — INSULIN ASPART 100 UNIT/ML ~~LOC~~ SOLN
8.0000 [IU] | Freq: Three times a day (TID) | SUBCUTANEOUS | Status: DC
  Administered 2016-01-16 – 2016-01-17 (×3): 8 [IU] via SUBCUTANEOUS

## 2016-01-16 NOTE — Progress Notes (Addendum)
Patient ID: Allen Mooney, male   DOB: 10-09-1970, 46 y.o.   MRN: [id number]          [facility]    Date of Admission:  01/13/2016    Total days of antibiotics 3        Day 2 cefazolin Principal Problem:   Staphylococcus aureus bacteremia Active Problems:   Perinephric fluid collection   UTI (urinary tract infection)   Hyperglycemia   Hyperosmolar syndrome   Hydronephrosis   Tobacco abuse   Diabetes mellitus (HCC)   Hypertension   Atherosclerotic peripheral vascular disease (HCC)   Normocytic anemia   History of nephrolithiasis   .  ceFAZolin (ANCEF) IV  2 g Intravenous Q8H  . insulin aspart  0-5 Units Subcutaneous QHS  . insulin aspart  0-9 Units Subcutaneous TID WC  . insulin aspart  4 Units Subcutaneous TID WC  . [START ON 01/16/2016] insulin glargine  12 Units Subcutaneous BH-q7a  . polyethylene glycol  17 g Oral BID  . senna-docusate  2 tablet Oral BID  . sodium chloride  1,000 mL Intravenous Once    Recommendations: 1. Agree with cefazolin 2. Repeat blood cultures are pending 3. Await results of transthoracic echocardiogram 4. Urology consultation for consideration of percutaneous drain 5. Hold off on PICC placement until blood cultures are negative   Assessment: [name] has staph aureus bacteremia, urinary tract infection and probably has a perinephric abscess. I agree with IV cefazolin, repeat blood cultures and transthoracic echocardiogram. I will hold off on PICC placement until we know blood cultures are negative. I would strongly consider drainage of the perinephric collection. If his transthoracic echocardiogram does not reveal any evidence of endocarditis I would proceed ahead with transesophageal echocardiogram.          Cliffton AstersJohn Darlen Gledhill, MD [facility] [phone] pager   [phone] cell 01/11/2015, 1:32 PM

## 2016-01-16 NOTE — Progress Notes (Signed)
  Progress Note   Date: 01/16/2016  Patient Name: Allen Mooney        MRN#: [id number]   The medication of 0.9% NaCl with KCL 40 mEq IV @ 75  was given for treatment of the diagnosis/condition of hypokalemia.     Standley Dakinslanford Brodie Scovell, MD

## 2016-01-16 NOTE — Progress Notes (Addendum)
PROGRESS NOTE    Allen Mooney  GLO:[id number]  DOB: January 29, 1970  DOA: 01/13/2016 PCP: Neale Burly, MD Outpatient Specialists:  Hospital course: Allen Mooney is an 46 y.o. male with hx of DM, HTN, known nephrolithiasis, recent Dx of perinephric hematoma, presented to the ER complaining of feeling weak, increased thrist and polyuria.  He has no fever, chills, nausea or vomiting.  He was evaluated and found to have a BS of 800's with concomitant pseudohyponatremia of 124, Hb of 11 g per dL, and an abdominal pelvic CT showed increasing size of the pernephric hematoma with resulting shift of the right kidney.  He also found to have a small kidney stone causing mild left hydronephrosis.  He was given IVF and SQ insulin, and his CBG did drop to 400's, prior to rising to 500's.  His UA was positive and he was started on IV Rocephin.  EDP spoke with urologist and hospitalist was asked to admit him for hyperosmolar syndrome, along with enlarging perinephric hematoma, and UTI with neprholithiasis and mild hydronephrosis.    Assessment & Plan:   1. Uncontrolled diabetes mellitus insulin requiring - Pt will need to be discharged on basal bolus insulin, education in process now, education kit ordered, pt prefers insulin pen, Continue daily lantus 20 units, plus novolog 8 units TIDAC plus sliding scale coverage.  Diabetes coordinator consulted for education. 2. Hyperosmolar nonketotic syndrome - Pt was sent to SDU on 1/6 and started on insulin drip, blood sugars controlled now and started on lantus and novolog.  Gentle IVFs and potassium replacement ordered.    3. Large perinephric hematoma - Monitored hemoglobin and it has been stable at 10.  Urology has been consulted to consider drainage of hematoma as ID is concerned that it may be source of infection.     4. Staph aureus bacteremia -  2/2 BC has been positive, same bug that was present in urine culture from September, ID consulted and following, continue  cefazolin 2 gm IV every 8 h,.  Repeat blood cultures didn't get drawn 1/7 for some reason, Re-ordered them 1/8.  Will need PICC line and 6 weeks of IV antibiotics.  Will also need TEE, will ask cardiology to eval 1/9 for TEE.   5. Chronic constipation - exacerbated by opioids, intensify regimen - see orders.  6. UTI - presumed staph aureus - treating as above.     DVT prophylaxis: SCD no anticoagulation because of hematoma Code Status: FULL  Family Communication: bedside Disposition Plan: TBD  Consultants:  urology  Subjective: Pt says he still has right flank pain but manageable, he is constipated, he had a BM but it was hard pellets.     Objective: Vitals:   01/15/16 1121 01/15/16 1440 01/15/16 2045 01/16/16 0640  BP:  119/78 131/80 (!) 151/85  Pulse: 86 94 96 90  Resp: '17 18 20 20  '$ Temp: 97.9 F (36.6 C) 98.5 F (36.9 C) 98.2 F (36.8 C) 98.5 F (36.9 C)  TempSrc: Oral Oral Oral Oral  SpO2: 99% 97% 97% 97%  Weight:      Height:        Intake/Output Summary (Last 24 hours) at 01/16/16 1233 Last data filed at 01/16/16 0900  Gross per 24 hour  Intake             1265 ml  Output                0 ml  Net  1265 ml   Filed Weights   01/13/16 1935 01/14/16 0307 01/15/16 0448  Weight: 65.8 kg (145 lb) 64.4 kg (141 lb 15.6 oz) 61.4 kg (135 lb 5.8 oz)    Exam:  General exam: emaciated, chronically ill appearing male, appears much older than stated age.  Respiratory system: Clear. No increased work of breathing. Cardiovascular system: S1 & S2 heard.  Gastrointestinal system: Abdomen is nondistended, soft, mild right flank pain. Normal bowel sounds heard. Central nervous system: Alert and oriented. No focal neurological deficits. Extremities: no CCE.  Data Reviewed: Basic Metabolic Panel:  Recent Labs Lab 01/13/16 2220 01/14/16 1100 01/15/16 0446 01/16/16 0504  NA 124* 133* 135 135  K 3.6 3.4* 3.2* 4.1  CL 87* 100* 104 105  CO2 '28 27 27 26  '$ GLUCOSE  828* 397* 62* 298*  BUN 24* '16 11 12  '$ CREATININE 1.33* 0.91 0.67 0.74  CALCIUM 8.2* 7.4* 7.1* 7.2*  MG  --   --  1.6* 1.9   Liver Function Tests: No results for input(s): AST, ALT, ALKPHOS, BILITOT, PROT, ALBUMIN in the last 168 hours. No results for input(s): LIPASE, AMYLASE in the last 168 hours. No results for input(s): AMMONIA in the last 168 hours. CBC:  Recent Labs Lab 01/13/16 2220 01/14/16 0347 01/14/16 1100 01/14/16 2137 01/15/16 0225 01/15/16 0446 01/16/16 0504  WBC 8.8 9.5 9.3  --   --  8.9 7.9  HGB 11.9* 11.6* 10.5* 10.3* 10.1* 10.3* 9.9*  HCT 35.8* 34.5* 31.3* 30.9* 29.7* 31.0* 31.4*  MCV 93.0 92.2 92.6  --   --  93.4 94.6  PLT 249 270 216  --   --  242 227   Cardiac Enzymes: No results for input(s): CKTOTAL, CKMB, CKMBINDEX, TROPONINI in the last 168 hours. CBG (last 3)   Recent Labs  01/16/16 0241 01/16/16 0752 01/16/16 1136  GLUCAP 201* 283* 362*   Recent Results (from the past 240 hour(s))  Blood culture (routine x 2)     Status: Abnormal   Collection Time: 01/13/16 11:30 PM  Result Value Ref Range Status   Specimen Description LEFT ANTECUBITAL  Final   Special Requests BOTTLES DRAWN AEROBIC AND ANAEROBIC [facility] EACH  Final   Culture  Setup Time   Final    GRAM POSITIVE COCCI Gram Stain Report Called to,Read Back By and Verified With: DAVIS,L. AT 0254 ON 01/14/2016 BY EVA IN BOTH AEROBIC AND ANAEROBIC BOTTLES Performed at [place], READ BACK BY AND VERIFIED WITH: J HERN,RN AT 2053 01/14/16 BY L BENFIELD Performed at [place] (A)  Final   Report Status 01/16/2016 FINAL  Final   Organism ID, Bacteria STAPHYLOCOCCUS AUREUS  Final      Susceptibility   Staphylococcus aureus - MIC*    CIPROFLOXACIN <=0.5 SENSITIVE Sensitive     ERYTHROMYCIN 0.5 SENSITIVE Sensitive     GENTAMICIN <=0.5 SENSITIVE Sensitive     OXACILLIN 0.5 SENSITIVE Sensitive     TETRACYCLINE <=1 SENSITIVE  Sensitive     VANCOMYCIN <=0.5 SENSITIVE Sensitive     TRIMETH/SULFA <=10 SENSITIVE Sensitive     CLINDAMYCIN <=0.25 SENSITIVE Sensitive     RIFAMPIN <=0.5 SENSITIVE Sensitive     Inducible Clindamycin NEGATIVE Sensitive     * STAPHYLOCOCCUS AUREUS  Blood Culture ID Panel (Reflexed)     Status: Abnormal   Collection Time: 01/13/16 11:30 PM  Result Value Ref Range Status   Enterococcus species NOT DETECTED  NOT DETECTED Final   Listeria monocytogenes NOT DETECTED NOT DETECTED Final   Staphylococcus species DETECTED (A) NOT DETECTED Final    Comment: CRITICAL RESULT CALLED TO, READ BACK BY AND VERIFIED WITH: J HERN,RN AT 2053 01/14/16 BY L BENFIELD    Staphylococcus aureus DETECTED (A) NOT DETECTED Final    Comment: CRITICAL RESULT CALLED TO, READ BACK BY AND VERIFIED WITH: J HERN,RN AT 2053 01/14/16 BY L BENFIELD    Methicillin resistance NOT DETECTED NOT DETECTED Final   Streptococcus species NOT DETECTED NOT DETECTED Final   Streptococcus agalactiae NOT DETECTED NOT DETECTED Final   Streptococcus pneumoniae NOT DETECTED NOT DETECTED Final   Streptococcus pyogenes NOT DETECTED NOT DETECTED Final   Acinetobacter baumannii NOT DETECTED NOT DETECTED Final   Enterobacteriaceae species NOT DETECTED NOT DETECTED Final   Enterobacter cloacae complex NOT DETECTED NOT DETECTED Final   Escherichia coli NOT DETECTED NOT DETECTED Final   Klebsiella oxytoca NOT DETECTED NOT DETECTED Final   Klebsiella pneumoniae NOT DETECTED NOT DETECTED Final   Proteus species NOT DETECTED NOT DETECTED Final   Serratia marcescens NOT DETECTED NOT DETECTED Final   Haemophilus influenzae NOT DETECTED NOT DETECTED Final   Neisseria meningitidis NOT DETECTED NOT DETECTED Final   Pseudomonas aeruginosa NOT DETECTED NOT DETECTED Final   Candida albicans NOT DETECTED NOT DETECTED Final   Candida glabrata NOT DETECTED NOT DETECTED Final   Candida krusei NOT DETECTED NOT DETECTED Final   Candida parapsilosis NOT  DETECTED NOT DETECTED Final   Candida tropicalis NOT DETECTED NOT DETECTED Final    Comment: Performed at [facility]  Blood culture (routine x 2)     Status: Abnormal   Collection Time: 01/13/16 11:43 PM  Result Value Ref Range Status   Specimen Description BLOOD LEFT ARM  Final   Special Requests BOTTLES DRAWN AEROBIC AND ANAEROBIC 4CC EACH  Final   Culture  Setup Time   Final    GRAM POSITIVE COCCI Gram Stain Report Called to,Read Back By and Verified With: DAVIS,L AT 1602 ON 01/14/2016 BY EVA IN BOTH AEROBIC AND ANAEROBIC BOTTLES Performed at [place], READ BACK BY AND VERIFIED WITH: J HERN,RN AT 2053 01/14/16 BY L BENFIELD    Culture (A)  Final    STAPHYLOCOCCUS AUREUS SUSCEPTIBILITIES PERFORMED ON PREVIOUS CULTURE WITHIN THE LAST 5 DAYS. Performed at [facility]    Report Status 01/16/2016 FINAL  Final  MRSA PCR Screening     Status: None   Collection Time: 01/14/16  6:08 PM  Result Value Ref Range Status   MRSA by PCR NEGATIVE NEGATIVE Final    Comment:        The GeneXpert MRSA Assay (FDA approved for NASAL specimens only), is one component of a comprehensive MRSA colonization surveillance program. It is not intended to diagnose MRSA infection nor to guide or monitor treatment for MRSA infections.   Culture, blood (Routine X 2) w Reflex to ID Panel     Status: None (Preliminary result)   Collection Time: 01/16/16 10:48 AM  Result Value Ref Range Status   Specimen Description BLOOD LEFT ARM  Final   Special Requests BOTTLES DRAWN AEROBIC AND ANAEROBIC 8CC  Final   Culture PENDING  Incomplete   Report Status PENDING  Incomplete  Culture, blood (Routine X 2) w Reflex to ID Panel     Status: None (Preliminary result)   Collection Time: 01/16/16 10:56 AM  Result Value Ref Range Status  Specimen Description BLOOD LEFT HAND  Final   Special Requests BOTTLES DRAWN AEROBIC AND ANAEROBIC [facility]  Final   Culture PENDING   Incomplete   Report Status PENDING  Incomplete    Studies: No results found.  Scheduled Meds: .  ceFAZolin (ANCEF) IV  2 g Intravenous Q8H  . insulin aspart  0-15 Units Subcutaneous TID WC  . insulin aspart  0-5 Units Subcutaneous QHS  . insulin aspart  8 Units Subcutaneous TID WC  . [START ON 01/17/2016] insulin glargine  20 Units Subcutaneous BH-q7a  . polyethylene glycol  17 g Oral BID  . senna-docusate  2 tablet Oral BID  . sodium chloride  1,000 mL Intravenous Once   Continuous Infusions: . 0.9 % NaCl with KCl 40 mEq / L 75 mL/hr (01/16/16 0307)   Principal Problem:   Staphylococcus aureus bacteremia Active Problems:   Hyperglycemia   Hyperosmolar syndrome   Hydronephrosis   Perinephric fluid collection   UTI (urinary tract infection)   Tobacco abuse   Diabetes mellitus (HCC)   Hypertension   Atherosclerotic peripheral vascular disease (HCC)   Normocytic anemia   History of nephrolithiasis  Time spent: 27 mins  Allen Brakeman, MD, FAAFP [facility] Pager [phone]  If 7PM-7AM, please contact night-coverage [email or web address] Password [email or web address] 01/16/2016, 12:33 PM    LOS: 2 days

## 2016-01-16 NOTE — Progress Notes (Signed)
Inpatient Diabetes Program Recommendations  AACE/ADA: New Consensus Statement on Inpatient Glycemic Control (2015)  Target Ranges:  Prepandial:   less than 140 mg/dL      Peak postprandial:   less than 180 mg/dL (1-2 hours)      Critically ill patients:  140 - 180 mg/dL   Lab Results  Component Value Date   GLUCAP 283 (H) 01/16/2016   HGBA1C >15.5 (H) 01/15/2016    Review of Glycemic Control Results for Allen Mooney, Allen Mooney (MRN [id number]) as of 01/16/2016 11:33  Ref. Range 01/15/2016 11:21 01/15/2016 16:03 01/15/2016 20:43 01/16/2016 02:41 01/16/2016 07:52  Glucose-Capillary Latest Ref Range: 65 - 99 mg/dL 196 (H) 157 (H) 237 (H) 201 (H) 283 (H)   Outpatient Diabetes medications: Metformin 500 mg tid Current orders for Inpatient glycemic control: Lantus 20 units + Novolog 8 units MC + Novolog correction 0-15 units tid + 0-5 units hs  Inpatient Diabetes Program Recommendations:  Spoke with patient by phone elevated  A1C results and explained what an A1C is, basic pathophysiology of DM Type 2, basic home care, basic diabetes diet nutrition principles, importance of checking CBGs and maintaining good CBG control to prevent long-term and short-term complications. Reviewed signs and symptoms of hyperglycemia and hypoglycemia and how to treat hypoglycemia at home. Also reviewed blood sugar goals at home.  RNs to provide ongoing basic DM education at bedside with this patient. Have ordered educational booklet, insulin starter kit, and DM videos. Have also placed RD consult for DM diet education for this patient.  Patient states he has been on the insulin pen basal and short acting in the past and took himself off and has not had a regular PCP. States he has been drinking a lot of sugary drinks and has not been following nutrition guidelines that he was taught when first diagnosed @ age 46. Patient states willingness to attend outpatient diabetes education and referral placed.  Thank you, [name]. Allen Iannone, RN, MSN,  CDE Inpatient Glycemic Control Team Team Pager [phone] (8am-5pm) 01/16/2016 11:42 AM

## 2016-01-16 NOTE — Progress Notes (Signed)
Initial Nutrition Assessment   INTERVENTION:  Provide DM education review   Answer pre-discharge diet edu follow up questions if pt desires  NUTRITION DIAGNOSIS:   Self- monitoring deficit related to his DM management as evidenced by his A1C-15.5%  GOAL: Improve  DM management compliance / glycemic control to enhance nutrient utilization    MONITOR:  CBG's, Po intake, labs and wt trends      REASON FOR ASSESSMENT:   Malnutrition Screening Tool, Consult Diet education  ASSESSMENT: Allen SellersDennis W Coxis an 46 y.o.malewith hx of DM, HTN, known nephrolithiasis, recent Dx of perinephric hematoma, presented to the ER complaining of feeling weak, increased thrist and polyuria. He has no fever, chills, nausea or vomiting. He was evaluated and found to have a BS of 800's with concomitant pseudohyponatremia of 124, Hb of 11 g per dL, and an abdominal pelvic CT showed increasing size of the pernephric hematoma with resulting shift of the right kidney. He also found to have a small kidney stone causing mild left hydronephrosis. He was given IVF and SQ insulin, and his CBG did drop to 400's, prior to rising to 500's. His UA was positive and he was started on IV Rocephin. EDP spoke with urologist and hospitalist was asked to admit him for hyperosmolar syndrome, along with enlarging perinephric hematoma, and UTI with neprholithiasis and mild hydronephrosis.   Patient says he was diagnosed with DM 15 years ago. At that time he attended a class and learned about serving sizes and est portions. Patient says he weighed around 200# when he was diagnosed.  For the past 2 years his weight has been averaging 145-150#. Based on hospital scales he is down 8% compared to 4 months ago. Trending toward significant. His appetite is very good 100% of meals consumed. He follows a CHO modified diet at home.  Unable to complete Nutrition-Focused physical exam at this time.  He is fully clothed. No evidence of  temporal wasting.   CBG (last 3)   Recent Labs  01/16/16 0241 01/16/16 0752 01/16/16 1136  GLUCAP 201* 283* 362*    Diet Order:  Diet Carb Modified Fluid consistency: Thin; Room service appropriate? Yes  Skin:  Reviewed, no issues  Last BM:  01/13/16 hx of constipation per pt  Height:   Ht Readings from Last 1 Encounters:  01/14/16 5\' 11"  (1.803 m)    Weight:   Wt Readings from Last 1 Encounters:  01/15/16 135 lb 5.8 oz (61.4 kg)    Ideal Body Weight:  78 kg  BMI:  Body mass index is 18.88 kg/m. (borderline underweight)  Estimated Nutritional Needs:   Kcal:  2135-2318  Protein:  73-80 gr  Fluid:  >1.8 liters daily  EDUCATION NEEDS: offered review of DM diet   Allen ShiversLynn Kion Huntsberry MS,RD,CSG,LDN Office: [phone] Pager: [phone]

## 2016-01-17 DIAGNOSIS — N151 Renal and perinephric abscess: Secondary | ICD-10-CM

## 2016-01-17 DIAGNOSIS — I70209 Unspecified atherosclerosis of native arteries of extremities, unspecified extremity: Secondary | ICD-10-CM

## 2016-01-17 LAB — GLUCOSE, CAPILLARY
GLUCOSE-CAPILLARY: 195 mg/dL — AB (ref 65–99)
Glucose-Capillary: 360 mg/dL — ABNORMAL HIGH (ref 65–99)
Glucose-Capillary: 286 mg/dL — ABNORMAL HIGH (ref 65–99)
Glucose-Capillary: 106 mg/dL — ABNORMAL HIGH (ref 65–99)

## 2016-01-17 LAB — BASIC METABOLIC PANEL
Anion gap: 4 — ABNORMAL LOW (ref 5–15)
BUN: 13 mg/dL (ref 6–20)
CALCIUM: 7.7 mg/dL — AB (ref 8.9–10.3)
CO2: 29 mmol/L (ref 22–32)
CREATININE: 0.8 mg/dL (ref 0.61–1.24)
Chloride: 101 mmol/L (ref 101–111)
GFR calc non Af Amer: >60 mL/min (ref >60)
GLUCOSE: 396 mg/dL — AB (ref 65–99)
Potassium: 4.4 mmol/L (ref 3.5–5.1)
Sodium: 134 mmol/L — ABNORMAL LOW (ref 135–145)

## 2016-01-17 LAB — CBC
HEMATOCRIT: 32.7 % — AB (ref 39.0–52.0)
Hemoglobin: 10.9 g/dL — ABNORMAL LOW (ref 13.0–17.0)
MCH: 31.1 pg (ref 26.0–34.0)
MCHC: 33.3 g/dL (ref 30.0–36.0)
MCV: 93.4 fL (ref 78.0–100.0)
Platelets: 274 10*3/uL (ref 150–400)
RBC: 3.5 MIL/uL — ABNORMAL LOW (ref 4.22–5.81)
RDW: 14.8 % (ref 11.5–15.5)
WBC: 8.4 10*3/uL (ref 4.0–10.5)

## 2016-01-17 LAB — MAGNESIUM: Magnesium: 1.9 mg/dL (ref 1.7–2.4)

## 2016-01-17 MED ORDER — SODIUM CHLORIDE 0.9 % IV SOLN
INTRAVENOUS | Status: DC
  Administered 2016-01-18: 06:00:00 via INTRAVENOUS

## 2016-01-17 MED ORDER — HYDROCODONE-ACETAMINOPHEN 5-325 MG PO TABS
1.0000 | ORAL_TABLET | Freq: Four times a day (QID) | ORAL | Status: AC | PRN
  Administered 2016-01-17 – 2016-01-18 (×3): 1 via ORAL
  Filled 2016-01-17 (×3): qty 1

## 2016-01-17 MED ORDER — INSULIN GLARGINE 100 UNIT/ML ~~LOC~~ SOLN
30.0000 [IU] | SUBCUTANEOUS | Status: DC
  Administered 2016-01-18 – 2016-01-20 (×3): 30 [IU] via SUBCUTANEOUS
  Filled 2016-01-17 (×6): qty 0.3

## 2016-01-17 MED ORDER — INSULIN ASPART 100 UNIT/ML ~~LOC~~ SOLN
12.0000 [IU] | Freq: Three times a day (TID) | SUBCUTANEOUS | Status: DC
  Administered 2016-01-17 – 2016-01-20 (×8): 12 [IU] via SUBCUTANEOUS

## 2016-01-17 NOTE — Progress Notes (Addendum)
Patient ID: Allen Mooney, male   DOB: 1970/06/23, 46 y.o.   MRN: [id number]         [facility]    Date of Admission:  01/13/2016    Total days of antibiotics 4        Day 3 cefazolin Principal Problem:   Staphylococcus aureus bacteremia Active Problems:   Perinephric fluid collection   UTI (urinary tract infection)   Hyperglycemia   Hyperosmolar syndrome   Hydronephrosis   Tobacco abuse   Diabetes mellitus (HCC)   Hypertension   Atherosclerotic peripheral vascular disease (HCC)   Normocytic anemia   History of nephrolithiasis   .  ceFAZolin (ANCEF) IV  2 g Intravenous Q8H  . insulin aspart  0-5 Units Subcutaneous QHS  . insulin aspart  0-9 Units Subcutaneous TID WC  . insulin aspart  4 Units Subcutaneous TID WC  . [START ON 01/16/2016] insulin glargine  12 Units Subcutaneous BH-q7a  . polyethylene glycol  17 g Oral BID  . senna-docusate  2 tablet Oral BID  . sodium chloride  1,000 mL Intravenous Once    Recommendations: 1. Continue cefazolin 2. Await results of repeat blood cultures 3. Transesophageal echocardiogram 4. Urology consultation for consideration of percutaneous drain 5. Hold off on PICC placement until blood cultures are negative   Assessment: [name] has staph aureus bacteremia, urinary tract infection and may have a perinephric abscess.  There is a question of small vegetations on the aortic and mitral valves on the transthoracic echocardiogram. I would proceed with transesophageal echocardiogram. We are awaiting urology evaluation for a perinephric fluid collection.         [name], MD [facility] [phone] pager   [phone] cell 01/11/2015, 1:32 PM

## 2016-01-17 NOTE — Progress Notes (Signed)
PROGRESS NOTE    Allen Mooney  VEL:[id number]  DOB: 04/17/1970  DOA: 01/13/2016 PCP: Neale Burly, MD Outpatient Specialists:  Hospital course: Allen Mooney is an 46 y.o. male with hx of DM, HTN, known nephrolithiasis, recent Dx of perinephric hematoma, presented to the ER complaining of feeling weak, increased thrist and polyuria.  He has no fever, chills, nausea or vomiting.  He was evaluated and found to have a BS of 800's with concomitant pseudohyponatremia of 124, Hb of 11 g per dL, and an abdominal pelvic CT showed increasing size of the pernephric hematoma with resulting shift of the right kidney.  He also found to have a small kidney stone causing mild left hydronephrosis.  He was given IVF and SQ insulin, and his CBG did drop to 400's, prior to rising to 500's.  His UA was positive and he was started on IV Rocephin.  EDP spoke with urologist and hospitalist was asked to admit him for hyperosmolar syndrome, along with enlarging perinephric hematoma, and UTI with nephrolithiasis and mild hydronephrosis.    Assessment & Plan:   1. Uncontrolled diabetes mellitus insulin requiring - Pt will need to be discharged on basal bolus insulin, education in process now, education kit ordered, pt prefers insulin pen, Continue daily lantus increase to 30 units, plus novolog 12 units TIDAC plus sliding scale coverage.  Further titrate for better control.  Has been difficult to control with current infection and abscess but will continue to work to better glycemic control.  Also patient was highly glucose toxic on admission with a1c>15.   Diabetes coordinator consulted for education.  Pt is having some vision changes related to tighter glycemic control and he will need to see an opthalmologist as soon as he is discharged for a dilated eye exam.  I am afraid that he may have diabetic retinopathy and has had some bleeding in the left eye.  He may need laser eye treatment to the retinas.  We discussed at  bedside.  2. Hyperosmolar nonketotic syndrome - Pt was sent to SDU on 1/6 and started on insulin drip, blood sugars better controlled now and started on lantus and novolog.  Gentle IVFs and potassium replacement ordered.    3. Large perinephric hematoma/abscess - Monitored hemoglobin and it has been stable at 10.  Urology has been consulted to consider drainage of hematoma as ID is concerned that it may be source of infection.     4. Staph aureus bacteremia -  2/2 BC has been positive, same bug that was present in urine culture from September, ID consulted and following, continue cefazolin 2 gm IV every 8 h,.  Repeat blood cultures didn't get drawn 1/7 for some reason, Re-ordered them 1/8.  No growth from repeat cultures but I would wait 48 hours before trying to place a PICC line.  Will need PICC line and 6 weeks of IV antibiotics.  Will also need TEE, will ask cardiology to eval 1/9 for TEE.  They plan to do TEE 1/10.  5. Chronic constipation - exacerbated by opioids, intensify regimen - see orders.  6. UTI - presumed staph aureus - treating as above.     DVT prophylaxis: SCD no anticoagulation because of hematoma Code Status: FULL  Family Communication: bedside Disposition Plan: TBD  Consultants:  urology  Subjective: Pt says he still has right flank pain but manageable, he is constipated.  He feels ill at times and its likely related to the infection and abscess.  Pt has some hemorrhage in left eye and cloudy vision.    Objective: Vitals:   01/16/16 1400 01/16/16 2119 01/17/16 0442 01/17/16 1044  BP: 124/76 (!) 155/89 138/83   Pulse: (!) 102 99 (!) 104   Resp: '20 18 18   '$ Temp: 98.1 F (36.7 C) 98.4 F (36.9 C) 98.2 F (36.8 C)   TempSrc:  Oral Oral   SpO2: 97% 99% 99% 100%  Weight:      Height:        Intake/Output Summary (Last 24 hours) at 01/17/16 1433 Last data filed at 01/17/16 0900  Gross per 24 hour  Intake              840 ml  Output                0 ml  Net               840 ml   Filed Weights   01/13/16 1935 01/14/16 0307 01/15/16 0448  Weight: 65.8 kg (145 lb) 64.4 kg (141 lb 15.6 oz) 61.4 kg (135 lb 5.8 oz)    Exam:  General exam: emaciated, chronically ill appearing male, appears much older than stated age.  Respiratory system: Clear. No increased work of breathing. Cardiovascular system: S1 & S2 heard.  Gastrointestinal system: Abdomen is nondistended, soft, mild right flank pain. Normal bowel sounds heard. Central nervous system: Alert and oriented. No focal neurological deficits. Extremities: no CCE.  Data Reviewed: Basic Metabolic Panel:  Recent Labs Lab 01/13/16 2220 01/14/16 1100 01/15/16 0446 01/16/16 0504 01/17/16 0608  NA 124* 133* 135 135 134*  K 3.6 3.4* 3.2* 4.1 4.4  CL 87* 100* 104 105 101  CO2 '28 27 27 26 29  '$ GLUCOSE 828* 397* 62* 298* 396*  BUN 24* '16 11 12 13  '$ CREATININE 1.33* 0.91 0.67 0.74 0.80  CALCIUM 8.2* 7.4* 7.1* 7.2* 7.7*  MG  --   --  1.6* 1.9 1.9   Liver Function Tests: No results for input(s): AST, ALT, ALKPHOS, BILITOT, PROT, ALBUMIN in the last 168 hours. No results for input(s): LIPASE, AMYLASE in the last 168 hours. No results for input(s): AMMONIA in the last 168 hours. CBC:  Recent Labs Lab 01/14/16 0347 01/14/16 1100 01/14/16 2137 01/15/16 0225 01/15/16 0446 01/16/16 0504 01/17/16 0608  WBC 9.5 9.3  --   --  8.9 7.9 8.4  HGB 11.6* 10.5* 10.3* 10.1* 10.3* 9.9* 10.9*  HCT 34.5* 31.3* 30.9* 29.7* 31.0* 31.4* 32.7*  MCV 92.2 92.6  --   --  93.4 94.6 93.4  PLT 270 216  --   --  242 227 274   Cardiac Enzymes: No results for input(s): CKTOTAL, CKMB, CKMBINDEX, TROPONINI in the last 168 hours. CBG (last 3)   Recent Labs  01/16/16 2128 01/17/16 0813 01/17/16 1137  GLUCAP 222* 360* 286*   Recent Results (from the past 240 hour(s))  Blood culture (routine x 2)     Status: Abnormal   Collection Time: 01/13/16 11:30 PM  Result Value Ref Range Status   Specimen Description LEFT  ANTECUBITAL  Final   Special Requests BOTTLES DRAWN AEROBIC AND ANAEROBIC [facility] EACH  Final   Culture  Setup Time   Final    GRAM POSITIVE COCCI Gram Stain Report Called to,Read Back By and Verified With: DAVIS,L. AT 4270 ON 01/14/2016 BY EVA IN BOTH AEROBIC AND ANAEROBIC BOTTLES Performed at [place], READ BACK BY AND VERIFIED  WITH: J HERN,RN AT 2053 01/14/16 BY L BENFIELD Performed at [place] (A)  Final   Report Status 01/16/2016 FINAL  Final   Organism ID, Bacteria STAPHYLOCOCCUS AUREUS  Final      Susceptibility   Staphylococcus aureus - MIC*    CIPROFLOXACIN <=0.5 SENSITIVE Sensitive     ERYTHROMYCIN 0.5 SENSITIVE Sensitive     GENTAMICIN <=0.5 SENSITIVE Sensitive     OXACILLIN 0.5 SENSITIVE Sensitive     TETRACYCLINE <=1 SENSITIVE Sensitive     VANCOMYCIN <=0.5 SENSITIVE Sensitive     TRIMETH/SULFA <=10 SENSITIVE Sensitive     CLINDAMYCIN <=0.25 SENSITIVE Sensitive     RIFAMPIN <=0.5 SENSITIVE Sensitive     Inducible Clindamycin NEGATIVE Sensitive     * STAPHYLOCOCCUS AUREUS  Blood Culture ID Panel (Reflexed)     Status: Abnormal   Collection Time: 01/13/16 11:30 PM  Result Value Ref Range Status   Enterococcus species NOT DETECTED NOT DETECTED Final   Listeria monocytogenes NOT DETECTED NOT DETECTED Final   Staphylococcus species DETECTED (A) NOT DETECTED Final    Comment: CRITICAL RESULT CALLED TO, READ BACK BY AND VERIFIED WITH: J HERN,RN AT 2053 01/14/16 BY L BENFIELD    Staphylococcus aureus DETECTED (A) NOT DETECTED Final    Comment: CRITICAL RESULT CALLED TO, READ BACK BY AND VERIFIED WITH: J HERN,RN AT 2053 01/14/16 BY L BENFIELD    Methicillin resistance NOT DETECTED NOT DETECTED Final   Streptococcus species NOT DETECTED NOT DETECTED Final   Streptococcus agalactiae NOT DETECTED NOT DETECTED Final   Streptococcus pneumoniae NOT DETECTED NOT DETECTED Final   Streptococcus pyogenes NOT  DETECTED NOT DETECTED Final   Acinetobacter baumannii NOT DETECTED NOT DETECTED Final   Enterobacteriaceae species NOT DETECTED NOT DETECTED Final   Enterobacter cloacae complex NOT DETECTED NOT DETECTED Final   Escherichia coli NOT DETECTED NOT DETECTED Final   Klebsiella oxytoca NOT DETECTED NOT DETECTED Final   Klebsiella pneumoniae NOT DETECTED NOT DETECTED Final   Proteus species NOT DETECTED NOT DETECTED Final   Serratia marcescens NOT DETECTED NOT DETECTED Final   Haemophilus influenzae NOT DETECTED NOT DETECTED Final   Neisseria meningitidis NOT DETECTED NOT DETECTED Final   Pseudomonas aeruginosa NOT DETECTED NOT DETECTED Final   Candida albicans NOT DETECTED NOT DETECTED Final   Candida glabrata NOT DETECTED NOT DETECTED Final   Candida krusei NOT DETECTED NOT DETECTED Final   Candida parapsilosis NOT DETECTED NOT DETECTED Final   Candida tropicalis NOT DETECTED NOT DETECTED Final    Comment: Performed at [facility]  Blood culture (routine x 2)     Status: Abnormal   Collection Time: 01/13/16 11:43 PM  Result Value Ref Range Status   Specimen Description BLOOD LEFT ARM  Final   Special Requests BOTTLES DRAWN AEROBIC AND ANAEROBIC 4CC EACH  Final   Culture  Setup Time   Final    GRAM POSITIVE COCCI Gram Stain Report Called to,Read Back By and Verified With: DAVIS,L AT 1602 ON 01/14/2016 BY EVA IN BOTH AEROBIC AND ANAEROBIC BOTTLES Performed at [place], READ BACK BY AND VERIFIED WITH: J HERN,RN AT 2053 01/14/16 BY L BENFIELD    Culture (A)  Final    STAPHYLOCOCCUS AUREUS SUSCEPTIBILITIES PERFORMED ON PREVIOUS CULTURE WITHIN THE LAST 5 DAYS. Performed at [facility]    Report Status 01/16/2016 FINAL  Final  MRSA PCR Screening     Status: None  Collection Time: 01/14/16  6:08 PM  Result Value Ref Range Status   MRSA by PCR NEGATIVE NEGATIVE Final    Comment:        The GeneXpert MRSA Assay (FDA approved for NASAL  specimens only), is one component of a comprehensive MRSA colonization surveillance program. It is not intended to diagnose MRSA infection nor to guide or monitor treatment for MRSA infections.   Culture, blood (Routine X 2) w Reflex to ID Panel     Status: None (Preliminary result)   Collection Time: 01/16/16 10:48 AM  Result Value Ref Range Status   Specimen Description BLOOD LEFT ARM  Final   Special Requests BOTTLES DRAWN AEROBIC AND ANAEROBIC 8CC  Final   Culture NO GROWTH < 24 HOURS  Final   Report Status PENDING  Incomplete  Culture, blood (Routine X 2) w Reflex to ID Panel     Status: None (Preliminary result)   Collection Time: 01/16/16 10:56 AM  Result Value Ref Range Status   Specimen Description BLOOD LEFT HAND  Final   Special Requests BOTTLES DRAWN AEROBIC AND ANAEROBIC 6CC  Final   Culture NO GROWTH < 24 HOURS  Final   Report Status PENDING  Incomplete    Studies: No results found.  Scheduled Meds: .  ceFAZolin (ANCEF) IV  2 g Intravenous Q8H  . insulin aspart  0-15 Units Subcutaneous TID WC  . insulin aspart  0-5 Units Subcutaneous QHS  . insulin aspart  12 Units Subcutaneous TID WC  . [START ON 01/18/2016] insulin glargine  30 Units Subcutaneous BH-q7a  . metoCLOPramide  10 mg Oral TID AC  . polyethylene glycol  34 g Oral BID  . senna-docusate  2 tablet Oral BID  . sodium chloride  1,000 mL Intravenous Once   Continuous Infusions: . sodium chloride     Principal Problem:   Staphylococcus aureus bacteremia Active Problems:   Hyperglycemia   Hyperosmolar syndrome   Hydronephrosis   Perinephric fluid collection   UTI (urinary tract infection)   Tobacco abuse   Diabetes mellitus (HCC)   Hypertension   Atherosclerotic peripheral vascular disease (HCC)   Normocytic anemia   History of nephrolithiasis  Time spent: 27 mins  Irwin Brakeman, MD, FAAFP [facility] Pager [phone]  If 7PM-7AM, please contact  night-coverage [email or web address] Password [email or web address] 01/17/2016, 2:33 PM    LOS: 3 days

## 2016-01-17 NOTE — Care Management Note (Signed)
Case Management Note  Patient Details  Name: Allen ShirtsDennis W Schnitker MRN: [id number] Date of Birth: [birth year]  Subjective/Objective:                  Pt admitted with sepsis and uncontrolled DM. Pt is from home, lives with his SI. He is ind with ADL's. He has PCP, He drives himself to appointments. He will need IV abx at DC. [facility] and Alroy BailiffLinda Lothian, of [facility], has been made aware of referral and will obtain pt info from chart. Pt will DC on insulin which is new to him. DC consult pending. Pt not yet ready for DC.   Action/Plan: Pt will DC home with IV abx.  Pt may need new pt appointment with Dr. Robynn PaneHasani moved closer, will determine at DC. Pt may need benefits check for cost of insulin.  CM will cont to follow.   Expected Discharge Date:      01/20/2016            Expected Discharge Plan:  Home w Home Health Services  In-House Referral:  NA  Discharge planning Services  CM Consult  Post Acute Care Choice:  Home Health Choice offered to:  Patient  HH Arranged:  RN, IV Antibiotics HH Agency:  [facility]  Status of Service:  In process, will continue to follow   Malcolm MetroChildress, Elan Mcelvain Demske, RN 01/17/2016, 8:57 AM

## 2016-01-17 NOTE — Consult Note (Signed)
Urology Consult  Consulting MD: Dr. Standley Dakinslanford  Johnson  CC: Perinephric fluid collection.  The right  HPI: This is a 46 year old male admitted currently for management of bacteremia.  He has an interesting history, dating back to the fall of 2017.  He apparently passed a kidney stone in September, where he presented with right flank pain.  He was noted have a small midureteral stone.  Following that, he apparently developed a spontaneous perinephric bleed in December, and [facility]  He was told that he had "a hole in his kidney."  Apparently he was hospitalized for a few days.  At that time, the patient states that he had no flank ecchymosis.  He has not developed that since that time.  He did not need blood products.  He did not have any intervention at that point, i.e. stent placement or perinephric drain.  He did not follow-up there.  Recently, he is admitted for increasing right flank pain as well as weakness.  CT revealed a significant retroperitoneal/perinephric fluid collection, possible hematoma.  Urologic consultation is requested.  The patient does have bacteremia and there is a question of endocarditis.  PMH: Past Medical History:  Diagnosis Date  . Diabetes mellitus without complication (HCC)   . Kidney stones   . Perinephric hematoma     PSH: History reviewed. No pertinent surgical history.  Allergies: No Known Allergies  Medications: Prescriptions Prior to Admission  Medication Sig Dispense Refill Last Dose  . metFORMIN (GLUCOPHAGE) 500 MG tablet Take 500 mg by mouth 3 (three) times daily.    01/12/2016 at Unknown time     Social History: Social History   Social History  . Marital status: Single    Spouse name: N/A  . Number of children: N/A  . Years of education: N/A   Occupational History  . Not on file.   Social History Main Topics  . Smoking status: Current Every Day Smoker   Packs/day: 1.00    Types: Cigarettes  . Smokeless tobacco: Never Used  . Alcohol use No  . Drug use: No  . Sexual activity: Not on file   Other Topics Concern  . Not on file   Social History Narrative  . No narrative on file    Family History: History reviewed. No pertinent family history.  Review of Systems: Positive: Flank pain, nausea, weaknes Negative:   A further 10 point review of systems was negative except what is listed in the HPI.  Physical Exam: @VITALS2 @ General: No acute distress.  Awake.  He appears older than his stated age. Head:  Normocephalic.  Atraumatic. ENT:  EOMI.  Mucous membranes moist.  Dentition poor Neck:  Supple.  No lymphadenopathy. CV:  S1 present. S2 present. Regular rate. Pulmonary: Equal effort bilaterally.   Abdomen: Soft.  There is a right flank mass noted which is tender. Skin:  Normal turgor.   Extremity: No gross deformity of bilateral upper extremities.  No gross deformity of bilateral lower extremities. Neurologic: Alert. Appropriate mood.    Studies:  Recent Labs     01/16/16  0504  01/17/16  0608  HGB  9.9*  10.9*  WBC  7.9  8.4  PLT  227  274    Recent Labs     01/16/16  0504  01/17/16  0608  NA  135  134*  K  4.1  4.4  CL  105  101  CO2  26  29  BUN  12  13  CREATININE  0.74  0.80  CALCIUM  7.2*  7.7*  GFRNONAA  >60  >60  GFRAA  >60  >60     No results for input(s): INR, APTT in the last 72 hours.  Invalid input(s): PT   Invalid input(s): ABG  I independently reviewed the above laboratory studies as well as the patient's CT scans.  Assessment:  Interesting clinical presentation.  Following a small ureteral stone back in September 2017.  The patient may have had forniceal extravasation of urine, and may have developed an infection.  This may have been complicated by a hematoma.  It is interesting that in 4 days hospitalization in [place], no intervention was needed.  Additionally, if he did have a  retroperitoneal/perinephric bleed, oftentimes, the patient will have flank ecchymosis, which he never experienced.  Currently, this hematoma may be infected.  This also may just be a urinoma.    Plan:  1.  I will call interventional radiology to assess the viability of percutaneous drainage-this may well be his best treatment option.  2.  This may well need to be left in for some time, and he will need follow-up imaging with IV contrast to make sure that there are no underlying renal abnormalities, and make sure there is no forniceal leak.  3.  We will follow him during this hospitalization with you.      Pager:[phone]

## 2016-01-17 NOTE — Progress Notes (Signed)
Patient with Staph Aureus bacteremia with abnormalities seen on transthoracic echocardiogram. Plan for TEE on 01/18/16 to evaluate for vegetations.

## 2016-01-18 ENCOUNTER — Encounter (HOSPITAL_COMMUNITY): Payer: Self-pay | Admitting: Anesthesiology

## 2016-01-18 ENCOUNTER — Encounter (HOSPITAL_COMMUNITY)
Admission: EM | Disposition: A | Discharge status: Home Health | Payer: Self-pay | Source: Home / Self Care | Attending: Family Medicine

## 2016-01-18 ENCOUNTER — Ambulatory Visit (HOSPITAL_COMMUNITY)
Admit: 2016-01-18 | Discharge: 2016-01-18 | Disposition: A | Discharge status: Home / Self Care | Payer: BLUE CROSS/BLUE SHIELD | Attending: Urology | Admitting: Urology

## 2016-01-18 ENCOUNTER — Encounter (HOSPITAL_COMMUNITY): Payer: Self-pay

## 2016-01-18 ENCOUNTER — Inpatient Hospital Stay (HOSPITAL_COMMUNITY): Payer: BLUE CROSS/BLUE SHIELD

## 2016-01-18 DIAGNOSIS — R7881 Bacteremia: Secondary | ICD-10-CM

## 2016-01-18 DIAGNOSIS — N151 Renal and perinephric abscess: Secondary | ICD-10-CM | POA: Diagnosis not present

## 2016-01-18 DIAGNOSIS — E870 Hyperosmolality and hypernatremia: Principal | ICD-10-CM

## 2016-01-18 DIAGNOSIS — D649 Anemia, unspecified: Secondary | ICD-10-CM

## 2016-01-18 DIAGNOSIS — N2889 Other specified disorders of kidney and ureter: Secondary | ICD-10-CM

## 2016-01-18 DIAGNOSIS — E109 Type 1 diabetes mellitus without complications: Secondary | ICD-10-CM

## 2016-01-18 HISTORY — PX: TEE WITHOUT CARDIOVERSION: SHX5443

## 2016-01-18 LAB — CBC
HEMATOCRIT: 31.8 % — AB (ref 39.0–52.0)
Hemoglobin: 9.9 g/dL — ABNORMAL LOW (ref 13.0–17.0)
MCH: 29.4 pg (ref 26.0–34.0)
MCHC: 31.1 g/dL (ref 30.0–36.0)
MCV: 94.4 fL (ref 78.0–100.0)
PLATELETS: 302 10*3/uL (ref 150–400)
RBC: 3.37 MIL/uL — AB (ref 4.22–5.81)
RDW: 14.4 % (ref 11.5–15.5)
WBC: 9.5 10*3/uL (ref 4.0–10.5)

## 2016-01-18 LAB — GLUCOSE, CAPILLARY
GLUCOSE-CAPILLARY: 199 mg/dL — AB (ref 65–99)
GLUCOSE-CAPILLARY: 59 mg/dL — AB (ref 65–99)
GLUCOSE-CAPILLARY: 92 mg/dL (ref 65–99)
GLUCOSE-CAPILLARY: 42 mg/dL — AB (ref 65–99)
GLUCOSE-CAPILLARY: 383 mg/dL — AB (ref 65–99)
Glucose-Capillary: 141 mg/dL — ABNORMAL HIGH (ref 65–99)

## 2016-01-18 LAB — BASIC METABOLIC PANEL
ANION GAP: 6 (ref 5–15)
BUN: 13 mg/dL (ref 6–20)
CALCIUM: 7.6 mg/dL — AB (ref 8.9–10.3)
CO2: 28 mmol/L (ref 22–32)
Chloride: 101 mmol/L (ref 101–111)
Creatinine, Ser: 0.68 mg/dL (ref 0.61–1.24)
Glucose, Bld: 217 mg/dL — ABNORMAL HIGH (ref 65–99)
POTASSIUM: 4.5 mmol/L (ref 3.5–5.1)
Sodium: 135 mmol/L (ref 135–145)

## 2016-01-18 LAB — MAGNESIUM: Magnesium: 1.8 mg/dL (ref 1.7–2.4)

## 2016-01-18 SURGERY — ECHOCARDIOGRAM, TRANSESOPHAGEAL
Anesthesia: Moderate Sedation

## 2016-01-18 MED ORDER — MIDAZOLAM HCL 2 MG/2ML IJ SOLN
INTRAMUSCULAR | Status: AC
  Filled 2016-01-18: qty 2

## 2016-01-18 MED ORDER — MIDAZOLAM HCL 2 MG/2ML IJ SOLN
INTRAMUSCULAR | Status: AC | PRN
  Administered 2016-01-18 (×2): 1 mg via INTRAVENOUS

## 2016-01-18 MED ORDER — LIDOCAINE HCL 1 % IJ SOLN
INTRAMUSCULAR | Status: AC
  Filled 2016-01-18: qty 20

## 2016-01-18 MED ORDER — FENTANYL CITRATE (PF) 100 MCG/2ML IJ SOLN
INTRAMUSCULAR | Status: AC
  Filled 2016-01-18: qty 2

## 2016-01-18 MED ORDER — DEXTROSE 50 % IV SOLN
INTRAVENOUS | Status: AC
  Administered 2016-01-18: 50 mL
  Filled 2016-01-18: qty 50

## 2016-01-18 MED ORDER — BUTAMBEN-TETRACAINE-BENZOCAINE 2-2-14 % EX AERO
INHALATION_SPRAY | CUTANEOUS | Status: DC | PRN
  Administered 2016-01-18: 1 via TOPICAL

## 2016-01-18 MED ORDER — MIDAZOLAM HCL 5 MG/5ML IJ SOLN
INTRAMUSCULAR | Status: DC | PRN
  Administered 2016-01-18 (×2): 2 mg via INTRAVENOUS

## 2016-01-18 MED ORDER — FENTANYL CITRATE (PF) 100 MCG/2ML IJ SOLN
INTRAMUSCULAR | Status: AC | PRN
  Administered 2016-01-18 (×2): 50 ug via INTRAVENOUS

## 2016-01-18 MED ORDER — MEPERIDINE HCL 50 MG/ML IJ SOLN
INTRAMUSCULAR | Status: DC | PRN
  Administered 2016-01-18: 25 mg via INTRAVENOUS
  Administered 2016-01-18: 50 mg via INTRAVENOUS

## 2016-01-18 MED ORDER — LIDOCAINE VISCOUS 2 % MT SOLN
OROMUCOSAL | Status: DC | PRN
  Administered 2016-01-18: 1 via OROMUCOSAL

## 2016-01-18 MED ORDER — LIDOCAINE VISCOUS 2 % MT SOLN
OROMUCOSAL | Status: AC
  Filled 2016-01-18: qty 15

## 2016-01-18 MED ORDER — MORPHINE SULFATE (PF) 2 MG/ML IV SOLN
2.0000 mg | Freq: Once | INTRAVENOUS | Status: AC
  Administered 2016-01-18: 2 mg via INTRAVENOUS
  Filled 2016-01-18: qty 1

## 2016-01-18 MED ORDER — HYDROCODONE-ACETAMINOPHEN 5-325 MG PO TABS
1.0000 | ORAL_TABLET | ORAL | Status: DC | PRN
  Administered 2016-01-18 – 2016-01-20 (×7): 2 via ORAL
  Filled 2016-01-18 (×7): qty 2

## 2016-01-18 MED ORDER — MIDAZOLAM HCL 5 MG/5ML IJ SOLN
INTRAMUSCULAR | Status: AC
Start: 2016-01-18 — End: 2016-01-19
  Filled 2016-01-18: qty 10

## 2016-01-18 NOTE — Sedation Documentation (Addendum)
Procedure finished. Pt is resting well at this time with no complaints

## 2016-01-18 NOTE — Interval H&P Note (Signed)
History and Physical Interval Note: No interval changes. Plan to proceed with TEE  01/18/2016 2:00 PM  Ventura SellersDennis W Zentner  has presented today for surgery, with the diagnosis of Bacteremia  The various methods of treatment have been discussed with the patient and family. After consideration of risks, benefits and other options for treatment, the patient has consented to  Procedure(s): TRANSESOPHAGEAL ECHOCARDIOGRAM (TEE) (N/A) as a surgical intervention .  The patient's history has been reviewed, patient examined, no change in status, stable for surgery.  I have reviewed the patient's chart and labs.  Questions were answered to the patient's satisfaction.     Prentice DockerSuresh Jovanna Hodges

## 2016-01-18 NOTE — Progress Notes (Signed)
Patient ID: Allen Mooney, male   DOB: November 09, 1970, 46 y.o.   MRN: [id number]          [facility]    Date of Admission:  01/13/2016    Total days of antibiotics 5        Day 4 cefazolin Principal Problem:   Staphylococcus aureus bacteremia Active Problems:   Perinephric fluid collection   UTI (urinary tract infection)   Hyperglycemia   Hyperosmolar syndrome   Hydronephrosis   Tobacco abuse   Diabetes mellitus (HCC)   Hypertension   Atherosclerotic peripheral vascular disease (HCC)   Normocytic anemia   History of nephrolithiasis   .  ceFAZolin (ANCEF) IV  2 g Intravenous Q8H  . insulin aspart  0-5 Units Subcutaneous QHS  . insulin aspart  0-9 Units Subcutaneous TID WC  . insulin aspart  4 Units Subcutaneous TID WC  . [START ON 01/16/2016] insulin glargine  12 Units Subcutaneous BH-q7a  . polyethylene glycol  17 g Oral BID  . senna-docusate  2 tablet Oral BID  . sodium chloride  1,000 mL Intravenous Once    Recommendations: 1. Continue cefazolin 2. Okay to place PICC tomorrow if blood cultures remain negative 3. Transesophageal echocardiogram 4. IR to place percutaneous drain And perinephric fluid collection  Assessment: [name] has staph aureus bacteremia, urinary tract infection and may have a perinephric abscess.  There is a question of small vegetations on the aortic and mitral valves on the transthoracic echocardiogram. I would proceed with transesophageal echocardiogram and a percutaneous drain placement in perinephric fluid collection.         Cliffton AstersJohn Sienna Stonehocker, MD [facility] [phone] pager   [phone] cell

## 2016-01-18 NOTE — Progress Notes (Signed)
PROGRESS NOTE    Allen Mooney  ZOX:[id number]  DOB: Dec 12, 1970  DOA: 01/13/2016 PCP: Neale Burly, MD Outpatient Specialists:  Hospital course: Allen Mooney is an 46 y.o. male with hx of DM, HTN, known nephrolithiasis, recent Dx of perinephric hematoma, presented to the ER complaining of feeling weak, increased thrist and polyuria.  He has no fever, chills, nausea or vomiting.  He was evaluated and found to have a BS of 800's with concomitant pseudohyponatremia of 124, Hb of 11 g per dL, and an abdominal pelvic CT showed increasing size of the pernephric hematoma with resulting shift of the right kidney.  He also found to have a small kidney stone causing mild left hydronephrosis.  He was given IVF and SQ insulin, and his CBG did drop to 400's, prior to rising to 500's.  His UA was positive and he was started on IV Rocephin.  EDP spoke with urologist and hospitalist was asked to admit him for hyperosmolar syndrome, along with enlarging perinephric hematoma, and UTI with nephrolithiasis and mild hydronephrosis.    Assessment & Plan:   1. Uncontrolled diabetes mellitus insulin requiring - Pt will need to be discharged on basal bolus insulin, education in process now, education kit ordered, pt prefers insulin pen, Continue daily lantus increase to 30 units, plus novolog 12 units TIDAC plus sliding scale coverage.  Further titrate for better control.  Has been difficult to control with current infection and abscess but will continue to work to better glycemic control.  Also patient was highly glucose toxic on admission with a1c>15.   Diabetes coordinator consulted for education.  Pt is having some vision changes related to tighter glycemic control and he will need to see an opthalmologist as soon as he is discharged for a dilated eye exam.  I am afraid that he may have diabetic retinopathy and has had some bleeding in the left eye.  He may need laser eye treatment to the retinas.  We discussed at  bedside.  2. Hyperosmolar nonketotic syndrome - Pt was sent to SDU on 1/6 and started on insulin drip, blood sugars better controlled now and started on lantus and novolog.  Gentle IVFs and potassium replacement ordered.    3. Large perinephric hematoma/abscess - urology has seen the patient. He underwent CT guided drain placement on 1/10      4. Staph aureus bacteremia -  2/2 BC has been positive, same bug that was present in urine culture from September, ID consulted and following, continue cefazolin 2 gm IV every 8 h,.  Repeat blood cultures didn't get drawn 1/7 for some reason, Re-ordered them 1/8.  No growth from repeat cultures, will have PICC line placed on 1/11.  Will need PICC line and prolonged course of IV antibiotics.  TEE done by cardiology did not show vegetations.  5. Chronic constipation - exacerbated by opioids, intensify regimen - see orders.  6. UTI - presumed staph aureus - treating as above.     DVT prophylaxis: SCD no anticoagulation because of hematoma Code Status: FULL  Family Communication: no family present Disposition Plan: TBD  Consultants:  Urology  Interventional radiology  cardiology  Subjective: Complains of pain in right flank   Objective: Vitals:   01/18/16 1515 01/18/16 1520 01/18/16 1536 01/18/16 1701  BP: 103/74 102/76 124/77 118/70  Pulse: 91 89 82 (!) 111  Resp: _0 Temp:      TempSrc:      SpO2: 97% 93%  95% 100%  Weight:      Height:        Intake/Output Summary (Last 24 hours) at 01/18/16 1859 Last data filed at 01/18/16 1800  Gross per 24 hour  Intake          1329.92 ml  Output              470 ml  Net           859.92 ml   Filed Weights   01/13/16 1935 01/14/16 0307 01/15/16 0448  Weight: 65.8 kg (145 lb) 64.4 kg (141 lb 15.6 oz) 61.4 kg (135 lb 5.8 oz)    Exam:  General exam: emaciated, chronically ill appearing male, appears much older than stated age.  Respiratory system: Clear. No increased work of  breathing. Cardiovascular system: S1 & S2 heard.  Gastrointestinal system: Abdomen is nondistended, soft, tender in right flank. Normal bowel sounds heard. Central nervous system: Alert and oriented. No focal neurological deficits. Extremities: no CCE.  Data Reviewed: Basic Metabolic Panel:  Recent Labs Lab 01/14/16 1100 01/15/16 0446 01/16/16 0504 01/17/16 0608 01/18/16 0557  NA 133* 135 135 134* 135  K 3.4* 3.2* 4.1 4.4 4.5  CL 100* 104 105 101 101  CO2 _0 GLUCOSE 397* 62* 298* 396* 217*  BUN _1 CREATININE 0.91 0.67 0.74 0.80 0.68  CALCIUM 7.4* 7.1* 7.2* 7.7* 7.6*  MG  --  1.6* 1.9 1.9 1.8   Liver Function Tests: No results for input(s): AST, ALT, ALKPHOS, BILITOT, PROT, ALBUMIN in the last 168 hours. No results for input(s): LIPASE, AMYLASE in the last 168 hours. No results for input(s): AMMONIA in the last 168 hours. CBC:  Recent Labs Lab 01/14/16 1100  01/15/16 0225 01/15/16 0446 01/16/16 0504 01/17/16 0608 01/18/16 0557  WBC 9.3  --   --  8.9 7.9 8.4 9.5  HGB 10.5*  < > 10.1* 10.3* 9.9* 10.9* 9.9*  HCT 31.3*  < > 29.7* 31.0* 31.4* 32.7* 31.8*  MCV 92.6  --   --  93.4 94.6 93.4 94.4  PLT 216  --   --  242 227 274 302  < > = values in this interval not displayed. Cardiac Enzymes: No results for input(s): CKTOTAL, CKMB, CKMBINDEX, TROPONINI in the last 168 hours. CBG (last 3)   Recent Labs  01/18/16 1330 01/18/16 1615 01/18/16 1700  GLUCAP 92 42* 141*   Recent Results (from the past 240 hour(s))  Blood culture (routine x 2)     Status: Abnormal   Collection Time: 01/13/16 11:30 PM  Result Value Ref Range Status   Specimen Description LEFT ANTECUBITAL  Final   Special Requests BOTTLES DRAWN AEROBIC AND ANAEROBIC [facility] EACH  Final   Culture  Setup Time   Final    GRAM POSITIVE COCCI Gram Stain Report Called to,Read Back By and Verified With: DAVIS,L. AT 3976 ON 01/14/2016 BY EVA IN BOTH AEROBIC AND ANAEROBIC BOTTLES Performed  at [place], READ BACK BY AND VERIFIED WITH: J HERN,RN AT 2053 01/14/16 BY L BENFIELD Performed at [place] (A)  Final   Report Status 01/16/2016 FINAL  Final   Organism ID, Bacteria STAPHYLOCOCCUS AUREUS  Final      Susceptibility   Staphylococcus aureus - MIC*    CIPROFLOXACIN <=0.5 SENSITIVE Sensitive     ERYTHROMYCIN 0.5 SENSITIVE Sensitive  GENTAMICIN <=0.5 SENSITIVE Sensitive     OXACILLIN 0.5 SENSITIVE Sensitive     TETRACYCLINE <=1 SENSITIVE Sensitive     VANCOMYCIN <=0.5 SENSITIVE Sensitive     TRIMETH/SULFA <=10 SENSITIVE Sensitive     CLINDAMYCIN <=0.25 SENSITIVE Sensitive     RIFAMPIN <=0.5 SENSITIVE Sensitive     Inducible Clindamycin NEGATIVE Sensitive     * STAPHYLOCOCCUS AUREUS  Blood Culture ID Panel (Reflexed)     Status: Abnormal   Collection Time: 01/13/16 11:30 PM  Result Value Ref Range Status   Enterococcus species NOT DETECTED NOT DETECTED Final   Listeria monocytogenes NOT DETECTED NOT DETECTED Final   Staphylococcus species DETECTED (A) NOT DETECTED Final    Comment: CRITICAL RESULT CALLED TO, READ BACK BY AND VERIFIED WITH: J HERN,RN AT 2053 01/14/16 BY L BENFIELD    Staphylococcus aureus DETECTED (A) NOT DETECTED Final    Comment: CRITICAL RESULT CALLED TO, READ BACK BY AND VERIFIED WITH: J HERN,RN AT 2053 01/14/16 BY L BENFIELD    Methicillin resistance NOT DETECTED NOT DETECTED Final   Streptococcus species NOT DETECTED NOT DETECTED Final   Streptococcus agalactiae NOT DETECTED NOT DETECTED Final   Streptococcus pneumoniae NOT DETECTED NOT DETECTED Final   Streptococcus pyogenes NOT DETECTED NOT DETECTED Final   Acinetobacter baumannii NOT DETECTED NOT DETECTED Final   Enterobacteriaceae species NOT DETECTED NOT DETECTED Final   Enterobacter cloacae complex NOT DETECTED NOT DETECTED Final   Escherichia coli NOT DETECTED NOT DETECTED Final   Klebsiella oxytoca NOT  DETECTED NOT DETECTED Final   Klebsiella pneumoniae NOT DETECTED NOT DETECTED Final   Proteus species NOT DETECTED NOT DETECTED Final   Serratia marcescens NOT DETECTED NOT DETECTED Final   Haemophilus influenzae NOT DETECTED NOT DETECTED Final   Neisseria meningitidis NOT DETECTED NOT DETECTED Final   Pseudomonas aeruginosa NOT DETECTED NOT DETECTED Final   Candida albicans NOT DETECTED NOT DETECTED Final   Candida glabrata NOT DETECTED NOT DETECTED Final   Candida krusei NOT DETECTED NOT DETECTED Final   Candida parapsilosis NOT DETECTED NOT DETECTED Final   Candida tropicalis NOT DETECTED NOT DETECTED Final    Comment: Performed at [facility]  Blood culture (routine x 2)     Status: Abnormal   Collection Time: 01/13/16 11:43 PM  Result Value Ref Range Status   Specimen Description BLOOD LEFT ARM  Final   Special Requests BOTTLES DRAWN AEROBIC AND ANAEROBIC 4CC EACH  Final   Culture  Setup Time   Final    GRAM POSITIVE COCCI Gram Stain Report Called to,Read Back By and Verified With: DAVIS,L AT 1602 ON 01/14/2016 BY EVA IN BOTH AEROBIC AND ANAEROBIC BOTTLES Performed at [place], READ BACK BY AND VERIFIED WITH: J HERN,RN AT 2053 01/14/16 BY L BENFIELD    Culture (A)  Final    STAPHYLOCOCCUS AUREUS SUSCEPTIBILITIES PERFORMED ON PREVIOUS CULTURE WITHIN THE LAST 5 DAYS. Performed at [facility]    Report Status 01/16/2016 FINAL  Final  MRSA PCR Screening     Status: None   Collection Time: 01/14/16  6:08 PM  Result Value Ref Range Status   MRSA by PCR NEGATIVE NEGATIVE Final    Comment:        The GeneXpert MRSA Assay (FDA approved for NASAL specimens only), is one component of a comprehensive MRSA colonization surveillance program. It is not intended to diagnose MRSA infection nor to guide or monitor treatment for MRSA infections.  Culture, blood (Routine X 2) w Reflex to ID Panel     Status: None (Preliminary result)    Collection Time: 01/16/16 10:48 AM  Result Value Ref Range Status   Specimen Description BLOOD LEFT ARM  Final   Special Requests BOTTLES DRAWN AEROBIC AND ANAEROBIC 8CC  Final   Culture NO GROWTH 2 DAYS  Final   Report Status PENDING  Incomplete  Culture, blood (Routine X 2) w Reflex to ID Panel     Status: None (Preliminary result)   Collection Time: 01/16/16 10:56 AM  Result Value Ref Range Status   Specimen Description BLOOD LEFT HAND  Final   Special Requests BOTTLES DRAWN AEROBIC AND ANAEROBIC 6CC  Final   Culture NO GROWTH 2 DAYS  Final   Report Status PENDING  Incomplete  Aerobic/Anaerobic Culture (surgical/deep wound)     Status: None (Preliminary result)   Collection Time: 01/18/16 11:25 AM  Result Value Ref Range Status   Specimen Description ABSCESS  Final   Special Requests PERINEPHRIC ABSCESS DRAIN  Final   Gram Stain   Final    ABUNDANT WBC PRESENT, PREDOMINANTLY MONONUCLEAR ABUNDANT GRAM POSITIVE COCCI IN CLUSTERS Performed at [facility]    Culture PENDING  Incomplete   Report Status PENDING  Incomplete    Studies: Ct Image Guided Drainage By Percutaneous Catheter  Result Date: 01/18/2016 INDICATION: RIGHT PERINEPHRIC ABSCESS EXAM: CT GUIDED DRAINAGE OF RIGHT PERINEPHRIC ABSCESS MEDICATIONS: The patient is currently admitted to the hospital and receiving intravenous antibiotics. The antibiotics were administered within an appropriate time frame prior to the initiation of the procedure. ANESTHESIA/SEDATION: 2.0 mg IV Versed 100 mcg IV Fentanyl Moderate Sedation Time:  15 MINUTES The patient was continuously monitored during the procedure by the interventional radiology nurse under my direct supervision. COMPLICATIONS: None immediate. TECHNIQUE: Informed written consent was obtained from the patient after a thorough discussion of the procedural risks, benefits and alternatives. All questions were addressed. Maximal Sterile Barrier Technique was utilized  including caps, mask, sterile gowns, sterile gloves, sterile drape, hand hygiene and skin antiseptic. A timeout was performed prior to the initiation of the procedure. PROCEDURE: previous imaging reviewed. patient position left side down decubitus. noncontrast localization ct performed. the right perinephric abscess was localized. overlying skin marked. under sterile conditions and local anesthesia, an 18 gauge 10 cm access needle was advanced percutaneously into the abscess. there was return of purulent fluid. sample sent for gram stain and culture. amplatz guidewire inserted followed by dilatation to advance a 10 french drain. catheter position confirmed with ct. syringe aspiration yielded 290 cc purulent fluid. catheter secured with a prolene suture and connected to external suction bulb. sterile dressing applied. no immediate complication. patient tolerated the procedure well. FINDINGS: imaging confirms percutaneous needle access of the right perinephric abscess for drain insertion IMPRESSION: Successful CT-guided right perinephric abscess drain insertion. Electronically Signed   By: Jerilynn Mages.  Shick M.D.   On: 01/18/2016 12:25    Scheduled Meds: .  ceFAZolin (ANCEF) IV  2 g Intravenous Q8H  . fentaNYL      . insulin aspart  0-15 Units Subcutaneous TID WC  . insulin aspart  0-5 Units Subcutaneous QHS  . insulin aspart  12 Units Subcutaneous TID WC  . insulin glargine  30 Units Subcutaneous BH-q7a  . lidocaine      . midazolam      . polyethylene glycol  34 g Oral BID  . senna-docusate  2 tablet Oral BID  . sodium chloride  1,000 mL Intravenous Once   Continuous Infusions:  Principal Problem:   Staphylococcus aureus bacteremia Active Problems:   Hyperglycemia   Hyperosmolar syndrome   Hydronephrosis   Perinephric fluid collection   UTI (urinary tract infection)   Tobacco abuse   Diabetes mellitus (HCC)   Hypertension   Atherosclerotic peripheral vascular disease (HCC)   Normocytic anemia    History of nephrolithiasis  Time spent: 53mns  MKathie Dike MD [facility] Pager [phone] If 7PM-7AM, please contact night-coverage [email or web address] Password [email or web address] 01/18/2016, 6:59 PM    LOS: 4 days

## 2016-01-18 NOTE — Sedation Documentation (Signed)
Patient is resting comfortably. 

## 2016-01-18 NOTE — Sedation Documentation (Signed)
Pt tolerating procedure well.

## 2016-01-18 NOTE — Consult Note (Signed)
Chief Complaint: right perinephric hematoma  Referring Physician:Dr. Franchot Gallo  Supervising Physician: Daryll Brod  Patient Status: [facility] - inpt  HPI: Allen Mooney is an 46 y.o. male who has a history of kidney stones.  He was seen in the ED in September 2017 secondary to hematuria and was found to have a right obstructing ureteral stone.  He was discharged on Cipro and ibuprofen.  He presented to [facility] in December with right flank pain.  He had a CT scan that revealed a hemorrhage of around the right kidney.  He was transferred to [facility] and the patient states released 3 days later.  He began having nausea, weakness, and diplopia on 01/13/16.  He went to [facility] where he was admitted with sugars in the 800s.  He had a repeat CT scan of his abdomen that revealed significant interval increase in size of the complex right perinephric fluid collection, suggesting hematoma.  Urology was consulted and has requested aspiration vs percutaneous drainage of this area.  The patient has been brought to [facility] IR for this procedure.   Past Medical History:  Past Medical History:  Diagnosis Date  . Diabetes mellitus without complication ([place])   . Kidney stones   . Perinephric hematoma     Past Surgical History: History reviewed. No pertinent surgical history.  Family History: History reviewed. No pertinent family history.  Social History:  reports that he has been smoking Cigarettes.  He has been smoking about 1.00 pack per day. He has never used smokeless tobacco. He reports that he does not drink alcohol or use drugs.  Allergies: No Known Allergies  Medications: Medications reviewed in Epic  Please HPI for pertinent positives, otherwise complete 10 system ROS negative, except blurry vision in his right eye and loss of vision in his left eye.    Mallampati Score: MD Evaluation Airway: WNL Heart: WNL Abdomen: WNL Chest/ Lungs: WNL ASA  Classification: 3 Mallampati/Airway Score:  One  Physical Exam: BP (!) 155/100   Pulse 88   Resp 20   SpO2 100%  There is no height or weight on file to calculate BMI. General: pleasant, WD, WN white male who is laying in bed in NAD HEENT: head is normocephalic, atraumatic.  Sclera are noninjected.  PERRL.  Ears and nose without any masses or lesions.  Mouth is pink and moist Heart: regular, rate, and rhythm.  Normal s1,s2. No obvious murmurs, gallops, or rubs noted.  Palpable radial and pedal pulses bilaterally Lungs: CTAB, no wheezes, rhonchi, or rales noted.  Respiratory effort nonlabored Abd: soft, NT, ND, +BS, no masses, hernias, or organomegaly Psych: A&Ox3 with an appropriate affect.   Labs: Results for orders placed or performed during the hospital encounter of 01/13/16 (from the past 48 hour(s))  Culture, blood (Routine X 2) w Reflex to ID Panel     Status: None (Preliminary result)   Collection Time: 01/16/16 10:48 AM  Result Value Ref Range   Specimen Description BLOOD LEFT ARM    Special Requests BOTTLES DRAWN AEROBIC AND ANAEROBIC 8CC    Culture NO GROWTH < 24 HOURS    Report Status PENDING   Culture, blood (Routine X 2) w Reflex to ID Panel     Status: None (Preliminary result)   Collection Time: 01/16/16 10:56 AM  Result Value Ref Range   Specimen Description BLOOD LEFT HAND    Special Requests BOTTLES DRAWN AEROBIC AND ANAEROBIC 6CC    Culture NO GROWTH < 24 HOURS  Report Status PENDING   Glucose, capillary     Status: Abnormal   Collection Time: 01/16/16 11:36 AM  Result Value Ref Range   Glucose-Capillary 362 (H) 65 - 99 mg/dL   Comment 1 Notify RN   Glucose, capillary     Status: Abnormal   Collection Time: 01/16/16  4:57 PM  Result Value Ref Range   Glucose-Capillary 298 (H) 65 - 99 mg/dL  Glucose, capillary     Status: Abnormal   Collection Time: 01/16/16  9:28 PM  Result Value Ref Range   Glucose-Capillary 222 (H) 65 - 99 mg/dL   Comment 1 Notify RN    Comment 2 Document in Chart   CBC      Status: Abnormal   Collection Time: 01/17/16  6:08 AM  Result Value Ref Range   WBC 8.4 4.0 - 10.5 K/uL   RBC 3.50 (L) 4.22 - 5.81 MIL/uL   Hemoglobin 10.9 (L) 13.0 - 17.0 g/dL   HCT 32.7 (L) 39.0 - 52.0 %   MCV 93.4 78.0 - 100.0 fL   MCH 31.1 26.0 - 34.0 pg   MCHC 33.3 30.0 - 36.0 g/dL   RDW 14.8 11.5 - 15.5 %   Platelets 274 150 - 400 K/uL  Basic metabolic panel     Status: Abnormal   Collection Time: 01/17/16  6:08 AM  Result Value Ref Range   Sodium 134 (L) 135 - 145 mmol/L   Potassium 4.4 3.5 - 5.1 mmol/L   Chloride 101 101 - 111 mmol/L   CO2 29 22 - 32 mmol/L   Glucose, Bld 396 (H) 65 - 99 mg/dL   BUN 13 6 - 20 mg/dL   Creatinine, Ser 0.80 0.61 - 1.24 mg/dL   Calcium 7.7 (L) 8.9 - 10.3 mg/dL   GFR calc non Af Amer >60 >60 mL/min   GFR calc Af Amer >60 >60 mL/min    Comment: (NOTE) The eGFR has been calculated using the CKD EPI equation. This calculation has not been validated in all clinical situations. eGFR's persistently <60 mL/min signify possible Chronic Kidney Disease.    Anion gap 4 (L) 5 - 15  Magnesium     Status: None   Collection Time: 01/17/16  6:08 AM  Result Value Ref Range   Magnesium 1.9 1.7 - 2.4 mg/dL  Glucose, capillary     Status: Abnormal   Collection Time: 01/17/16  8:13 AM  Result Value Ref Range   Glucose-Capillary 360 (H) 65 - 99 mg/dL   Comment 1 Notify RN   Glucose, capillary     Status: Abnormal   Collection Time: 01/17/16 11:37 AM  Result Value Ref Range   Glucose-Capillary 286 (H) 65 - 99 mg/dL   Comment 1 Notify RN   Glucose, capillary     Status: Abnormal   Collection Time: 01/17/16  4:37 PM  Result Value Ref Range   Glucose-Capillary 195 (H) 65 - 99 mg/dL   Comment 1 Notify RN   Glucose, capillary     Status: Abnormal   Collection Time: 01/17/16  9:08 PM  Result Value Ref Range   Glucose-Capillary 106 (H) 65 - 99 mg/dL   Comment 1 Notify RN    Comment 2 Document in Chart   CBC     Status: Abnormal   Collection Time:  01/18/16  5:57 AM  Result Value Ref Range   WBC 9.5 4.0 - 10.5 K/uL   RBC 3.37 (L) 4.22 - 5.81 MIL/uL   Hemoglobin  9.9 (L) 13.0 - 17.0 g/dL   HCT 31.8 (L) 39.0 - 52.0 %   MCV 94.4 78.0 - 100.0 fL   MCH 29.4 26.0 - 34.0 pg   MCHC 31.1 30.0 - 36.0 g/dL   RDW 14.4 11.5 - 15.5 %   Platelets 302 150 - 400 K/uL  Basic metabolic panel     Status: Abnormal   Collection Time: 01/18/16  5:57 AM  Result Value Ref Range   Sodium 135 135 - 145 mmol/L   Potassium 4.5 3.5 - 5.1 mmol/L   Chloride 101 101 - 111 mmol/L   CO2 28 22 - 32 mmol/L   Glucose, Bld 217 (H) 65 - 99 mg/dL   BUN 13 6 - 20 mg/dL   Creatinine, Ser 0.68 0.61 - 1.24 mg/dL   Calcium 7.6 (L) 8.9 - 10.3 mg/dL   GFR calc non Af Amer >60 >60 mL/min   GFR calc Af Amer >60 >60 mL/min    Comment: (NOTE) The eGFR has been calculated using the CKD EPI equation. This calculation has not been validated in all clinical situations. eGFR's persistently <60 mL/min signify possible Chronic Kidney Disease.    Anion gap 6 5 - 15  Magnesium     Status: None   Collection Time: 01/18/16  5:57 AM  Result Value Ref Range   Magnesium 1.8 1.7 - 2.4 mg/dL  Glucose, capillary     Status: Abnormal   Collection Time: 01/18/16  7:12 AM  Result Value Ref Range   Glucose-Capillary 199 (H) 65 - 99 mg/dL   Comment 1 Notify RN    Comment 2 Document in Chart     Imaging: No results found.  Assessment/Plan 1. Right perinephric hematoma -we will plan for aspiration vs drain placement today of this fluid collection -labs and vitals have been reviewed -Risks and Benefits discussed with the patient including bleeding, infection, damage to adjacent structures, bowel perforation/fistula connection, and sepsis. All of the patient's questions were answered, patient is agreeable to proceed. Consent signed and in chart.  Thank you for this interesting consult.  I greatly enjoyed meeting Mozell Hardacre Calise and look forward to participating in their care.  A copy of  this report was sent to the requesting provider on this date.  Electronically Signed: Henreitta Cea 01/18/2016, 9:04 AM   I spent a total of 40 Minutes    in face to face in clinical consultation, greater than 50% of which was counseling/coordinating care for right perinephric fluid collection

## 2016-01-18 NOTE — Procedures (Signed)
Renal abscess  S/p CT RT PERINEPHRIC ABSCESS DRAIN 290CC PUS ASPIRATED  cx sent No comp Stable Full report in pacs

## 2016-01-18 NOTE — Progress Notes (Signed)
*  PRELIMINARY RESULTS* Echocardiogram Echocardiogram Transesophageal has been performed.  Stacey DrainWhite, Ozzy Bohlken J 01/18/2016, 3:39 PM

## 2016-01-18 NOTE — Sedation Documentation (Signed)
[facility] here, [facility]

## 2016-01-18 NOTE — Sedation Documentation (Signed)
Denies pain

## 2016-01-18 NOTE — Sedation Documentation (Signed)
[facility] Drain site is unremarkable. [facility] called for transport back to [facility], will continue to monitor pt.

## 2016-01-18 NOTE — Sedation Documentation (Signed)
Pt remains stable at this time, no complaints.

## 2016-01-18 NOTE — H&P (View-Only) (Signed)
PROGRESS NOTE    Allen Mooney  BMW:[id number]  DOB: Jun 15, 1970  DOA: 01/13/2016 PCP: Neale Burly, MD Outpatient Specialists:  Hospital course: Allen Mooney is an 46 y.o. male with hx of DM, HTN, known nephrolithiasis, recent Dx of perinephric hematoma, presented to the ER complaining of feeling weak, increased thrist and polyuria.  He has no fever, chills, nausea or vomiting.  He was evaluated and found to have a BS of 800's with concomitant pseudohyponatremia of 124, Hb of 11 g per dL, and an abdominal pelvic CT showed increasing size of the pernephric hematoma with resulting shift of the right kidney.  He also found to have a small kidney stone causing mild left hydronephrosis.  He was given IVF and SQ insulin, and his CBG did drop to 400's, prior to rising to 500's.  His UA was positive and he was started on IV Rocephin.  EDP spoke with urologist and hospitalist was asked to admit him for hyperosmolar syndrome, along with enlarging perinephric hematoma, and UTI with nephrolithiasis and mild hydronephrosis.    Assessment & Plan:   1. Uncontrolled diabetes mellitus insulin requiring - Pt will need to be discharged on basal bolus insulin, education in process now, education kit ordered, pt prefers insulin pen, Continue daily lantus increase to 30 units, plus novolog 12 units TIDAC plus sliding scale coverage.  Further titrate for better control.  Has been difficult to control with current infection and abscess but will continue to work to better glycemic control.  Also patient was highly glucose toxic on admission with a1c>15.   Diabetes coordinator consulted for education.  Pt is having some vision changes related to tighter glycemic control and he will need to see an opthalmologist as soon as he is discharged for a dilated eye exam.  I am afraid that he may have diabetic retinopathy and has had some bleeding in the left eye.  He may need laser eye treatment to the retinas.  We discussed at  bedside.  2. Hyperosmolar nonketotic syndrome - Pt was sent to SDU on 1/6 and started on insulin drip, blood sugars better controlled now and started on lantus and novolog.  Gentle IVFs and potassium replacement ordered.    3. Large perinephric hematoma/abscess - Monitored hemoglobin and it has been stable at 10.  Urology has been consulted to consider drainage of hematoma as ID is concerned that it may be source of infection.     4. Staph aureus bacteremia -  2/2 BC has been positive, same bug that was present in urine culture from September, ID consulted and following, continue cefazolin 2 gm IV every 8 h,.  Repeat blood cultures didn't get drawn 1/7 for some reason, Re-ordered them 1/8.  No growth from repeat cultures but I would wait 48 hours before trying to place a PICC line.  Will need PICC line and 6 weeks of IV antibiotics.  Will also need TEE, will ask cardiology to eval 1/9 for TEE.  They plan to do TEE 1/10.  5. Chronic constipation - exacerbated by opioids, intensify regimen - see orders.  6. UTI - presumed staph aureus - treating as above.     DVT prophylaxis: SCD no anticoagulation because of hematoma Code Status: FULL  Family Communication: bedside Disposition Plan: TBD  Consultants:  urology  Subjective: Pt says he still has right flank pain but manageable, he is constipated.  He feels ill at times and its likely related to the infection and abscess.  Pt has some hemorrhage in left eye and cloudy vision.    Objective: Vitals:   01/16/16 1400 01/16/16 2119 01/17/16 0442 01/17/16 1044  BP: 124/76 (!) 155/89 138/83   Pulse: (!) 102 99 (!) 104   Resp: '20 18 18   '$ Temp: 98.1 F (36.7 C) 98.4 F (36.9 C) 98.2 F (36.8 C)   TempSrc:  Oral Oral   SpO2: 97% 99% 99% 100%  Weight:      Height:        Intake/Output Summary (Last 24 hours) at 01/17/16 1433 Last data filed at 01/17/16 0900  Gross per 24 hour  Intake              840 ml  Output                0 ml  Net               840 ml   Filed Weights   01/13/16 1935 01/14/16 0307 01/15/16 0448  Weight: 65.8 kg (145 lb) 64.4 kg (141 lb 15.6 oz) 61.4 kg (135 lb 5.8 oz)    Exam:  General exam: emaciated, chronically ill appearing male, appears much older than stated age.  Respiratory system: Clear. No increased work of breathing. Cardiovascular system: S1 & S2 heard.  Gastrointestinal system: Abdomen is nondistended, soft, mild right flank pain. Normal bowel sounds heard. Central nervous system: Alert and oriented. No focal neurological deficits. Extremities: no CCE.  Data Reviewed: Basic Metabolic Panel:  Recent Labs Lab 01/13/16 2220 01/14/16 1100 01/15/16 0446 01/16/16 0504 01/17/16 0608  NA 124* 133* 135 135 134*  K 3.6 3.4* 3.2* 4.1 4.4  CL 87* 100* 104 105 101  CO2 '28 27 27 26 29  '$ GLUCOSE 828* 397* 62* 298* 396*  BUN 24* '16 11 12 13  '$ CREATININE 1.33* 0.91 0.67 0.74 0.80  CALCIUM 8.2* 7.4* 7.1* 7.2* 7.7*  MG  --   --  1.6* 1.9 1.9   Liver Function Tests: No results for input(s): AST, ALT, ALKPHOS, BILITOT, PROT, ALBUMIN in the last 168 hours. No results for input(s): LIPASE, AMYLASE in the last 168 hours. No results for input(s): AMMONIA in the last 168 hours. CBC:  Recent Labs Lab 01/14/16 0347 01/14/16 1100 01/14/16 2137 01/15/16 0225 01/15/16 0446 01/16/16 0504 01/17/16 0608  WBC 9.5 9.3  --   --  8.9 7.9 8.4  HGB 11.6* 10.5* 10.3* 10.1* 10.3* 9.9* 10.9*  HCT 34.5* 31.3* 30.9* 29.7* 31.0* 31.4* 32.7*  MCV 92.2 92.6  --   --  93.4 94.6 93.4  PLT 270 216  --   --  242 227 274   Cardiac Enzymes: No results for input(s): CKTOTAL, CKMB, CKMBINDEX, TROPONINI in the last 168 hours. CBG (last 3)   Recent Labs  01/16/16 2128 01/17/16 0813 01/17/16 1137  GLUCAP 222* 360* 286*   Recent Results (from the past 240 hour(s))  Blood culture (routine x 2)     Status: Abnormal   Collection Time: 01/13/16 11:30 PM  Result Value Ref Range Status   Specimen Description LEFT  ANTECUBITAL  Final   Special Requests BOTTLES DRAWN AEROBIC AND ANAEROBIC [facility] EACH  Final   Culture  Setup Time   Final    GRAM POSITIVE COCCI Gram Stain Report Called to,Read Back By and Verified With: DAVIS,L. AT 2831 ON 01/14/2016 BY EVA IN BOTH AEROBIC AND ANAEROBIC BOTTLES Performed at [place], READ BACK BY AND VERIFIED  WITH: J HERN,RN AT 2053 01/14/16 BY L BENFIELD Performed at [place] (A)  Final   Report Status 01/16/2016 FINAL  Final   Organism ID, Bacteria STAPHYLOCOCCUS AUREUS  Final      Susceptibility   Staphylococcus aureus - MIC*    CIPROFLOXACIN <=0.5 SENSITIVE Sensitive     ERYTHROMYCIN 0.5 SENSITIVE Sensitive     GENTAMICIN <=0.5 SENSITIVE Sensitive     OXACILLIN 0.5 SENSITIVE Sensitive     TETRACYCLINE <=1 SENSITIVE Sensitive     VANCOMYCIN <=0.5 SENSITIVE Sensitive     TRIMETH/SULFA <=10 SENSITIVE Sensitive     CLINDAMYCIN <=0.25 SENSITIVE Sensitive     RIFAMPIN <=0.5 SENSITIVE Sensitive     Inducible Clindamycin NEGATIVE Sensitive     * STAPHYLOCOCCUS AUREUS  Blood Culture ID Panel (Reflexed)     Status: Abnormal   Collection Time: 01/13/16 11:30 PM  Result Value Ref Range Status   Enterococcus species NOT DETECTED NOT DETECTED Final   Listeria monocytogenes NOT DETECTED NOT DETECTED Final   Staphylococcus species DETECTED (A) NOT DETECTED Final    Comment: CRITICAL RESULT CALLED TO, READ BACK BY AND VERIFIED WITH: J HERN,RN AT 2053 01/14/16 BY L BENFIELD    Staphylococcus aureus DETECTED (A) NOT DETECTED Final    Comment: CRITICAL RESULT CALLED TO, READ BACK BY AND VERIFIED WITH: J HERN,RN AT 2053 01/14/16 BY L BENFIELD    Methicillin resistance NOT DETECTED NOT DETECTED Final   Streptococcus species NOT DETECTED NOT DETECTED Final   Streptococcus agalactiae NOT DETECTED NOT DETECTED Final   Streptococcus pneumoniae NOT DETECTED NOT DETECTED Final   Streptococcus pyogenes NOT  DETECTED NOT DETECTED Final   Acinetobacter baumannii NOT DETECTED NOT DETECTED Final   Enterobacteriaceae species NOT DETECTED NOT DETECTED Final   Enterobacter cloacae complex NOT DETECTED NOT DETECTED Final   Escherichia coli NOT DETECTED NOT DETECTED Final   Klebsiella oxytoca NOT DETECTED NOT DETECTED Final   Klebsiella pneumoniae NOT DETECTED NOT DETECTED Final   Proteus species NOT DETECTED NOT DETECTED Final   Serratia marcescens NOT DETECTED NOT DETECTED Final   Haemophilus influenzae NOT DETECTED NOT DETECTED Final   Neisseria meningitidis NOT DETECTED NOT DETECTED Final   Pseudomonas aeruginosa NOT DETECTED NOT DETECTED Final   Candida albicans NOT DETECTED NOT DETECTED Final   Candida glabrata NOT DETECTED NOT DETECTED Final   Candida krusei NOT DETECTED NOT DETECTED Final   Candida parapsilosis NOT DETECTED NOT DETECTED Final   Candida tropicalis NOT DETECTED NOT DETECTED Final    Comment: Performed at [facility]  Blood culture (routine x 2)     Status: Abnormal   Collection Time: 01/13/16 11:43 PM  Result Value Ref Range Status   Specimen Description BLOOD LEFT ARM  Final   Special Requests BOTTLES DRAWN AEROBIC AND ANAEROBIC 4CC EACH  Final   Culture  Setup Time   Final    GRAM POSITIVE COCCI Gram Stain Report Called to,Read Back By and Verified With: DAVIS,L AT 1602 ON 01/14/2016 BY EVA IN BOTH AEROBIC AND ANAEROBIC BOTTLES Performed at [place], READ BACK BY AND VERIFIED WITH: J HERN,RN AT 2053 01/14/16 BY L BENFIELD    Culture (A)  Final    STAPHYLOCOCCUS AUREUS SUSCEPTIBILITIES PERFORMED ON PREVIOUS CULTURE WITHIN THE LAST 5 DAYS. Performed at [place]    Report Status 01/16/2016 FINAL  Final  MRSA PCR Screening     Status: None  Collection Time: 01/14/16  6:08 PM  Result Value Ref Range Status   MRSA by PCR NEGATIVE NEGATIVE Final    Comment:        The GeneXpert MRSA Assay (FDA approved for NASAL  specimens only), is one component of a comprehensive MRSA colonization surveillance program. It is not intended to diagnose MRSA infection nor to guide or monitor treatment for MRSA infections.   Culture, blood (Routine X 2) w Reflex to ID Panel     Status: None (Preliminary result)   Collection Time: 01/16/16 10:48 AM  Result Value Ref Range Status   Specimen Description BLOOD LEFT ARM  Final   Special Requests BOTTLES DRAWN AEROBIC AND ANAEROBIC 8CC  Final   Culture NO GROWTH < 24 HOURS  Final   Report Status PENDING  Incomplete  Culture, blood (Routine X 2) w Reflex to ID Panel     Status: None (Preliminary result)   Collection Time: 01/16/16 10:56 AM  Result Value Ref Range Status   Specimen Description BLOOD LEFT HAND  Final   Special Requests BOTTLES DRAWN AEROBIC AND ANAEROBIC 6CC  Final   Culture NO GROWTH < 24 HOURS  Final   Report Status PENDING  Incomplete    Studies: No results found.  Scheduled Meds: .  ceFAZolin (ANCEF) IV  2 g Intravenous Q8H  . insulin aspart  0-15 Units Subcutaneous TID WC  . insulin aspart  0-5 Units Subcutaneous QHS  . insulin aspart  12 Units Subcutaneous TID WC  . [START ON 01/18/2016] insulin glargine  30 Units Subcutaneous [id number]  . metoCLOPramide  10 mg Oral TID AC  . polyethylene glycol  34 g Oral BID  . senna-docusate  2 tablet Oral BID  . sodium chloride  1,000 mL Intravenous Once   Continuous Infusions: . sodium chloride     Principal Problem:   Staphylococcus aureus bacteremia Active Problems:   Hyperglycemia   Hyperosmolar syndrome   Hydronephrosis   Perinephric fluid collection   UTI (urinary tract infection)   Tobacco abuse   Diabetes mellitus (HCC)   Hypertension   Atherosclerotic peripheral vascular disease (HCC)   Normocytic anemia   History of nephrolithiasis  Time spent: 27 mins  Irwin Brakeman, MD, FAAFP [facility] Pager [phone]  If 7PM-7AM, please contact  night-coverage [email or web address] Password [email or web address] 01/17/2016, 2:33 PM    LOS: 3 days

## 2016-01-18 NOTE — Sedation Documentation (Addendum)
Barnetta ChapelKelly Osborne, PA in seeing pt.  Today's events reviewed, pt agreeable, call bell in reach.

## 2016-01-18 NOTE — Sedation Documentation (Signed)
Procedure started. Pt resting well with no complaints

## 2016-01-19 DIAGNOSIS — N151 Renal and perinephric abscess: Secondary | ICD-10-CM

## 2016-01-19 DIAGNOSIS — R739 Hyperglycemia, unspecified: Secondary | ICD-10-CM

## 2016-01-19 LAB — GLUCOSE, CAPILLARY
GLUCOSE-CAPILLARY: 241 mg/dL — AB (ref 65–99)
GLUCOSE-CAPILLARY: 153 mg/dL — AB (ref 65–99)
GLUCOSE-CAPILLARY: 280 mg/dL — AB (ref 65–99)
Glucose-Capillary: 190 mg/dL — ABNORMAL HIGH (ref 65–99)

## 2016-01-19 LAB — CBC
HCT: 33.6 % — ABNORMAL LOW (ref 39.0–52.0)
HEMOGLOBIN: 10.9 g/dL — AB (ref 13.0–17.0)
MCH: 30.4 pg (ref 26.0–34.0)
MCHC: 32.4 g/dL (ref 30.0–36.0)
MCV: 93.9 fL (ref 78.0–100.0)
Platelets: 370 10*3/uL (ref 150–400)
RBC: 3.58 MIL/uL — ABNORMAL LOW (ref 4.22–5.81)
RDW: 14.8 % (ref 11.5–15.5)
WBC: 8.3 10*3/uL (ref 4.0–10.5)

## 2016-01-19 LAB — BASIC METABOLIC PANEL
Anion gap: 5 (ref 5–15)
BUN: 14 mg/dL (ref 6–20)
CHLORIDE: 101 mmol/L (ref 101–111)
CO2: 28 mmol/L (ref 22–32)
CREATININE: 0.7 mg/dL (ref 0.61–1.24)
Calcium: 8 mg/dL — ABNORMAL LOW (ref 8.9–10.3)
Glucose, Bld: 238 mg/dL — ABNORMAL HIGH (ref 65–99)
Potassium: 4.2 mmol/L (ref 3.5–5.1)
SODIUM: 134 mmol/L — AB (ref 135–145)

## 2016-01-19 NOTE — Progress Notes (Signed)
PROGRESS NOTE    Allen Mooney  RVU:[id number]  DOB: 1970-03-31  DOA: 01/13/2016 PCP: Neale Burly, MD Outpatient Specialists:  Hospital course: Allen Mooney is an 46 y.o. male with hx of DM, HTN, known nephrolithiasis, recent Dx of perinephric hematoma, presented to the ER complaining of feeling weak, increased thrist and polyuria.  He has no fever, chills, nausea or vomiting.  He was evaluated and found to have a BS of 800's with concomitant pseudohyponatremia of 124, Hb of 11 g per dL, and an abdominal pelvic CT showed increasing size of the pernephric hematoma with resulting shift of the right kidney.  He also found to have a small kidney stone causing mild left hydronephrosis.  He was given IVF and SQ insulin, and his CBG did drop to 400's, prior to rising to 500's.  His UA was positive and he was started on IV Rocephin.  EDP spoke with urologist and hospitalist was asked to admit him for hyperosmolar syndrome, along with enlarging perinephric hematoma, and UTI with nephrolithiasis and mild hydronephrosis.    Assessment & Plan:   1. Uncontrolled diabetes mellitus insulin requiring - Pt will need to be discharged on basal bolus insulin, education in process now, education kit ordered, pt prefers insulin pen, Continue daily lantus increase to 30 units, plus novolog 12 units TIDAC plus sliding scale coverage.  Further titrate for better control.  Has been difficult to control with current infection and abscess but will continue to work to better glycemic control.  Also patient was highly glucose toxic on admission with a1c>15.   Diabetes coordinator consulted for education.  Pt is having some vision changes related to tighter glycemic control and he will need to see an opthalmologist as soon as he is discharged for a dilated eye exam.  I am afraid that he may have diabetic retinopathy and has had some bleeding in the left eye.  He may need laser eye treatment to the retinas.  We discussed at  bedside.  2. Hyperosmolar nonketotic syndrome - Pt was sent to SDU on 1/6 and started on insulin drip, blood sugars better controlled now and started on lantus and novolog.  Gentle IVFs and potassium replacement ordered.    3. Large perinephric hematoma/abscess - urology has seen the patient. He underwent CT guided drain placement on 1/10. Will try and schedule follow up with IR. He will need repeat imaging      4. Staph aureus bacteremia -  2/2 BC has been positive, same bug that was present in urine culture from September, ID consulted and following, continue cefazolin 2 gm IV every 8 h,.  Repeat blood cultures didn't get drawn 1/7 for some reason, Re-ordered them 1/8.  No growth from repeat cultures, will have PICC line placed on 1/11.  Will need PICC line and prolonged course of IV antibiotics.  TEE done by cardiology did not show vegetations.  5. Chronic constipation - exacerbated by opioids, intensify regimen - see orders.  6. UTI - presumed staph aureus - treating as above.     DVT prophylaxis: SCD no anticoagulation because of hematoma Code Status: FULL  Family Communication: no family present Disposition Plan: possible discharge home in the next 24hrs  Consultants:  Urology  Interventional radiology  cardiology  Subjective: Pain in right flank is better today. Output from drain is decreasing  Objective: Vitals:   01/18/16 1701 01/18/16 2104 01/19/16 0533 01/19/16 1300  BP: 118/70 117/78 124/82 139/80  Pulse: (!) 111 (!)  101 95 (!) 108  Resp: _0 Temp:  98.3 F (36.8 C) 99 F (37.2 C) 97.8 F (36.6 C)  TempSrc:  Oral Oral Oral  SpO2: 100% 98% 98% 98%  Weight:      Height:        Intake/Output Summary (Last 24 hours) at 01/19/16 1746 Last data filed at 01/19/16 1200  Gross per 24 hour  Intake           917.67 ml  Output              175 ml  Net           742.67 ml   Filed Weights   01/13/16 1935 01/14/16 0307 01/15/16 0448  Weight: 65.8 kg (145 lb) 64.4  kg (141 lb 15.6 oz) 61.4 kg (135 lb 5.8 oz)    Exam:  General exam: emaciated, chronically ill appearing male, appears much older than stated age.  Respiratory system: Clear. No increased work of breathing. Cardiovascular system: S1 & S2 heard.  Gastrointestinal system: Abdomen is nondistended, soft, tender in right flank. Drain noted from right flank. Normal bowel sounds heard. Central nervous system: Alert and oriented. No focal neurological deficits. Extremities: no CCE.  Data Reviewed: Basic Metabolic Panel:  Recent Labs Lab 01/15/16 0446 01/16/16 0504 01/17/16 9030 01/18/16 0557 01/19/16 0648  NA 135 135 134* 135 134*  K 3.2* 4.1 4.4 4.5 4.2  CL 104 105 101 101 101  CO2 _1 GLUCOSE 62* 298* 396* 217* 238*  BUN _2 CREATININE 0.67 0.74 0.80 0.68 0.70  CALCIUM 7.1* 7.2* 7.7* 7.6* 8.0*  MG 1.6* 1.9 1.9 1.8  --    Liver Function Tests: No results for input(s): AST, ALT, ALKPHOS, BILITOT, PROT, ALBUMIN in the last 168 hours. No results for input(s): LIPASE, AMYLASE in the last 168 hours. No results for input(s): AMMONIA in the last 168 hours. CBC:  Recent Labs Lab 01/15/16 0446 01/16/16 0504 01/17/16 0608 01/18/16 0557 01/19/16 0648  WBC 8.9 7.9 8.4 9.5 8.3  HGB 10.3* 9.9* 10.9* 9.9* 10.9*  HCT 31.0* 31.4* 32.7* 31.8* 33.6*  MCV 93.4 94.6 93.4 94.4 93.9  PLT 242 227 274 302 370   Cardiac Enzymes: No results for input(s): CKTOTAL, CKMB, CKMBINDEX, TROPONINI in the last 168 hours. CBG (last 3)   Recent Labs  01/19/16 0724 01/19/16 1120 01/19/16 1620  GLUCAP 241* 153* 190*   Recent Results (from the past 240 hour(s))  Blood culture (routine x 2)     Status: Abnormal   Collection Time: 01/13/16 11:30 PM  Result Value Ref Range Status   Specimen Description LEFT ANTECUBITAL  Final   Special Requests BOTTLES DRAWN AEROBIC AND ANAEROBIC [facility] EACH  Final   Culture  Setup Time   Final    GRAM POSITIVE COCCI Gram Stain Report Called  to,Read Back By and Verified With: DAVIS,L. AT 0923 ON 01/14/2016 BY EVA IN BOTH AEROBIC AND ANAEROBIC BOTTLES Performed at [place], READ BACK BY AND VERIFIED WITH: J HERN,RN AT 2053 01/14/16 BY L BENFIELD Performed at [place] (A)  Final   Report Status 01/16/2016 FINAL  Final   Organism ID, Bacteria STAPHYLOCOCCUS AUREUS  Final      Susceptibility   Staphylococcus aureus - MIC*    CIPROFLOXACIN <=0.5 SENSITIVE Sensitive     ERYTHROMYCIN 0.5 SENSITIVE Sensitive  GENTAMICIN <=0.5 SENSITIVE Sensitive     OXACILLIN 0.5 SENSITIVE Sensitive     TETRACYCLINE <=1 SENSITIVE Sensitive     VANCOMYCIN <=0.5 SENSITIVE Sensitive     TRIMETH/SULFA <=10 SENSITIVE Sensitive     CLINDAMYCIN <=0.25 SENSITIVE Sensitive     RIFAMPIN <=0.5 SENSITIVE Sensitive     Inducible Clindamycin NEGATIVE Sensitive     * STAPHYLOCOCCUS AUREUS  Blood Culture ID Panel (Reflexed)     Status: Abnormal   Collection Time: 01/13/16 11:30 PM  Result Value Ref Range Status   Enterococcus species NOT DETECTED NOT DETECTED Final   Listeria monocytogenes NOT DETECTED NOT DETECTED Final   Staphylococcus species DETECTED (A) NOT DETECTED Final    Comment: CRITICAL RESULT CALLED TO, READ BACK BY AND VERIFIED WITH: J HERN,RN AT 2053 01/14/16 BY L BENFIELD    Staphylococcus aureus DETECTED (A) NOT DETECTED Final    Comment: CRITICAL RESULT CALLED TO, READ BACK BY AND VERIFIED WITH: J HERN,RN AT 2053 01/14/16 BY L BENFIELD    Methicillin resistance NOT DETECTED NOT DETECTED Final   Streptococcus species NOT DETECTED NOT DETECTED Final   Streptococcus agalactiae NOT DETECTED NOT DETECTED Final   Streptococcus pneumoniae NOT DETECTED NOT DETECTED Final   Streptococcus pyogenes NOT DETECTED NOT DETECTED Final   Acinetobacter baumannii NOT DETECTED NOT DETECTED Final   Enterobacteriaceae species NOT DETECTED NOT DETECTED Final   Enterobacter cloacae  complex NOT DETECTED NOT DETECTED Final   Escherichia coli NOT DETECTED NOT DETECTED Final   Klebsiella oxytoca NOT DETECTED NOT DETECTED Final   Klebsiella pneumoniae NOT DETECTED NOT DETECTED Final   Proteus species NOT DETECTED NOT DETECTED Final   Serratia marcescens NOT DETECTED NOT DETECTED Final   Haemophilus influenzae NOT DETECTED NOT DETECTED Final   Neisseria meningitidis NOT DETECTED NOT DETECTED Final   Pseudomonas aeruginosa NOT DETECTED NOT DETECTED Final   Candida albicans NOT DETECTED NOT DETECTED Final   Candida glabrata NOT DETECTED NOT DETECTED Final   Candida krusei NOT DETECTED NOT DETECTED Final   Candida parapsilosis NOT DETECTED NOT DETECTED Final   Candida tropicalis NOT DETECTED NOT DETECTED Final    Comment: Performed at [facility]  Blood culture (routine x 2)     Status: Abnormal   Collection Time: 01/13/16 11:43 PM  Result Value Ref Range Status   Specimen Description BLOOD LEFT ARM  Final   Special Requests BOTTLES DRAWN AEROBIC AND ANAEROBIC 4CC EACH  Final   Culture  Setup Time   Final    GRAM POSITIVE COCCI Gram Stain Report Called to,Read Back By and Verified With: DAVIS,L AT 1602 ON 01/14/2016 BY EVA IN BOTH AEROBIC AND ANAEROBIC BOTTLES Performed at [place], READ BACK BY AND VERIFIED WITH: J HERN,RN AT 2053 01/14/16 BY L BENFIELD    Culture (A)  Final    STAPHYLOCOCCUS AUREUS SUSCEPTIBILITIES PERFORMED ON PREVIOUS CULTURE WITHIN THE LAST 5 DAYS. Performed at [facility]    Report Status 01/16/2016 FINAL  Final  MRSA PCR Screening     Status: None   Collection Time: 01/14/16  6:08 PM  Result Value Ref Range Status   MRSA by PCR NEGATIVE NEGATIVE Final    Comment:        The GeneXpert MRSA Assay (FDA approved for NASAL specimens only), is one component of a comprehensive MRSA colonization surveillance program. It is not intended to diagnose MRSA infection nor to guide or monitor  treatment for MRSA infections.  Culture, blood (Routine X 2) w Reflex to ID Panel     Status: None (Preliminary result)   Collection Time: 01/16/16 10:48 AM  Result Value Ref Range Status   Specimen Description BLOOD LEFT ARM  Final   Special Requests BOTTLES DRAWN AEROBIC AND ANAEROBIC 8CC  Final   Culture NO GROWTH 3 DAYS  Final   Report Status PENDING  Incomplete  Culture, blood (Routine X 2) w Reflex to ID Panel     Status: None (Preliminary result)   Collection Time: 01/16/16 10:56 AM  Result Value Ref Range Status   Specimen Description BLOOD LEFT HAND  Final   Special Requests BOTTLES DRAWN AEROBIC AND ANAEROBIC 6CC  Final   Culture NO GROWTH 3 DAYS  Final   Report Status PENDING  Incomplete  Aerobic/Anaerobic Culture (surgical/deep wound)     Status: None (Preliminary result)   Collection Time: 01/18/16 11:25 AM  Result Value Ref Range Status   Specimen Description ABSCESS  Final   Special Requests PERINEPHRIC ABSCESS DRAIN  Final   Gram Stain   Final    ABUNDANT WBC PRESENT, PREDOMINANTLY MONONUCLEAR ABUNDANT GRAM POSITIVE COCCI IN CLUSTERS    Culture   Final    ABUNDANT STAPHYLOCOCCUS AUREUS SUSCEPTIBILITIES TO FOLLOW Performed at [facility]    Report Status PENDING  Incomplete    Studies: Ct Image Guided Drainage By Percutaneous Catheter  Result Date: 01/18/2016 INDICATION: RIGHT PERINEPHRIC ABSCESS EXAM: CT GUIDED DRAINAGE OF RIGHT PERINEPHRIC ABSCESS MEDICATIONS: The patient is currently admitted to the hospital and receiving intravenous antibiotics. The antibiotics were administered within an appropriate time frame prior to the initiation of the procedure. ANESTHESIA/SEDATION: 2.0 mg IV Versed 100 mcg IV Fentanyl Moderate Sedation Time:  15 MINUTES The patient was continuously monitored during the procedure by the interventional radiology nurse under my direct supervision. COMPLICATIONS: None immediate. TECHNIQUE: Informed written consent was obtained from  the patient after a thorough discussion of the procedural risks, benefits and alternatives. All questions were addressed. Maximal Sterile Barrier Technique was utilized including caps, mask, sterile gowns, sterile gloves, sterile drape, hand hygiene and skin antiseptic. A timeout was performed prior to the initiation of the procedure. PROCEDURE: previous imaging reviewed. patient position left side down decubitus. noncontrast localization ct performed. the right perinephric abscess was localized. overlying skin marked. under sterile conditions and local anesthesia, an 18 gauge 10 cm access needle was advanced percutaneously into the abscess. there was return of purulent fluid. sample sent for gram stain and culture. amplatz guidewire inserted followed by dilatation to advance a 10 french drain. catheter position confirmed with ct. syringe aspiration yielded 290 cc purulent fluid. catheter secured with a prolene suture and connected to external suction bulb. sterile dressing applied. no immediate complication. patient tolerated the procedure well. FINDINGS: imaging confirms percutaneous needle access of the right perinephric abscess for drain insertion IMPRESSION: Successful CT-guided right perinephric abscess drain insertion. Electronically Signed   By: Jerilynn Mages.  Shick M.D.   On: 01/18/2016 12:25    Scheduled Meds: .  ceFAZolin (ANCEF) IV  2 g Intravenous Q8H  . insulin aspart  0-15 Units Subcutaneous TID WC  . insulin aspart  0-5 Units Subcutaneous QHS  . insulin aspart  12 Units Subcutaneous TID WC  . insulin glargine  30 Units Subcutaneous BH-q7a  . polyethylene glycol  34 g Oral BID  . senna-docusate  2 tablet Oral BID  . sodium chloride  1,000 mL Intravenous Once   Continuous Infusions:  Principal Problem:  Staphylococcus aureus bacteremia Active Problems:   Hyperglycemia   Hyperosmolar syndrome   Hydronephrosis   Perinephric abscess   UTI (urinary tract infection)   Tobacco abuse   Diabetes  mellitus (HCC)   Hypertension   Atherosclerotic peripheral vascular disease (HCC)   Normocytic anemia   History of nephrolithiasis  Time spent: 42mns  MKathie Dike MD [facility] Pager [phone] If 7PM-7AM, please contact night-coverage [email or web address] Password [email or web address] 01/19/2016, 5:46 PM    LOS: 5 days

## 2016-01-19 NOTE — Progress Notes (Signed)
Patient ID: Allen Mooney, male   DOB: February 18, 1970, 46 y.o.   MRN: [id number]          [facility]    Date of Admission:  01/13/2016    Total days of antibiotics 6        Day 5 cefazolin  Patient Active Problem List   Diagnosis Date Noted  . Staphylococcus aureus bacteremia 01/15/2016    Priority: High  . Perinephric abscess 01/14/2016    Priority: High  . UTI (urinary tract infection) 01/14/2016    Priority: High  . Diabetes mellitus (HCC) 01/15/2016  . Hypertension 01/15/2016  . Atherosclerotic peripheral vascular disease (HCC) 01/15/2016  . Normocytic anemia 01/15/2016  . History of nephrolithiasis 01/15/2016  . Hyperglycemia 01/14/2016  . 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 Hydronephrosis 01/14/2016  . Tobacco abuse 01/14/2016     .  ceFAZolin (ANCEF) IV  2 g Intravenous Q8H  . insulin aspart  0-5 Units Subcutaneous QHS  . insulin aspart  0-9 Units Subcutaneous TID WC  . insulin aspart  4 Units Subcutaneous TID WC  . [START ON 01/16/2016] insulin glargine  12 Units Subcutaneous BH-q7a  . polyethylene glycol  17 g Oral BID  . senna-docusate  2 tablet Oral BID  . sodium chloride  1,000 mL Intravenous Once    Recommendations: 1. Continue cefazolin About 2 more weeks 2. PICC placement 3. Monitor perinephric abscess drain output and repeat CT scan when output falls below 10-15 mL daily  Assessment: Mr. Allen Mooney has staph aureus bacteremia, urinary tract infection and a perinephric abscess.  There was no evidence of endocarditis seen on transesophageal echocardiogram. A percutaneous drain was placed in his abscess yesterday and he has had 645 mL out today. I would plan on about 3 weeks total antibiotic therapy with optimal duration determined by adequacy of abscess drainage assessed by repeat CT scan. I will sign off now but please call if I can be of further assistance.         Cliffton AstersJohn Ambermarie Honeyman, MD [facility] [phone] pager   [phone] cell

## 2016-01-19 NOTE — Progress Notes (Signed)
Inpatient Diabetes Program Recommendations  AACE/ADA: New Consensus Statement on Inpatient Glycemic Control (2015)  Target Ranges:  Prepandial:   less than 140 mg/dL      Peak postprandial:   less than 180 mg/dL (1-2 hours)      Critically ill patients:  140 - 180 mg/dL   Results for Allen ShirtsCOX, Allen Mooney (MRN [id number]) as of 01/19/2016 08:27  Ref. Range 01/18/2016 07:12 01/18/2016 12:27 01/18/2016 13:30 01/18/2016 16:15 01/18/2016 17:00 01/18/2016 21:41 01/19/2016 07:24  Glucose-Capillary Latest Ref Range: 65 - 99 mg/dL 409199 (H)  Novolog 3 units  Lantus 30 units 59 (L) 92 42 (LL) 141 (H) 383 (H)  Novolog 5 units 241 (H)  Novolog 17 units (12 MC+ 5 SSI)  Lantus 30 units   Review of Glycemic Control  Current orders for Inpatient glycemic control: Lantus 30 units QAM, Novolog 12 units TID with meals for meal coverage, Novolog 0-15 units TID with meals, Novolog 0-5 units QHS  Inpatient Diabetes Program Recommendations: Insulin - Basal: Patient received Lantus 30 units on 01/18/16 and glucose 59 mg/dl at 81:1912:27 and 42 mg/dl at 14:7816:15. Since patient experienced hypoglycemia several times while NPO yesterday, Lantus 30 units may be too much basal insulin. Patient has already received Lantus 30 units this morning. If patient experiences any further hypoglycemic episodes would recommend decreasing Lantus.  Thanks, Orlando PennerMarie Donnetta Gillin, RN, MSN, CDE Diabetes Coordinator Inpatient Diabetes Program [phone] (Team Pager from 8am to 5pm)

## 2016-01-20 ENCOUNTER — Encounter (HOSPITAL_COMMUNITY): Payer: Self-pay | Admitting: Cardiovascular Disease

## 2016-01-20 DIAGNOSIS — N151 Renal and perinephric abscess: Secondary | ICD-10-CM

## 2016-01-20 DIAGNOSIS — Z87442 Personal history of urinary calculi: Secondary | ICD-10-CM

## 2016-01-20 LAB — GLUCOSE, CAPILLARY
GLUCOSE-CAPILLARY: 115 mg/dL — AB (ref 65–99)
Glucose-Capillary: 242 mg/dL — ABNORMAL HIGH (ref 65–99)
Glucose-Capillary: 113 mg/dL — ABNORMAL HIGH (ref 65–99)

## 2016-01-20 MED ORDER — INSULIN GLARGINE 100 UNITS/ML SOLOSTAR PEN: 30.0000 [IU] | PEN_INJECTOR | Freq: Every day | SUBCUTANEOUS | 11 refills | Status: AC

## 2016-01-20 MED ORDER — CEFAZOLIN SODIUM-DEXTROSE 2-4 GM/100ML-% IV SOLN: 2.0000 g | Freq: Three times a day (TID) | INTRAVENOUS | 0 refills | Status: DC

## 2016-01-20 MED ORDER — INSULIN ASPART 100 UNIT/ML FLEXPEN: 12.0000 [IU] | PEN_INJECTOR | Freq: Three times a day (TID) | SUBCUTANEOUS | 11 refills | Status: AC

## 2016-01-20 MED ORDER — PEN NEEDLES 29G X 12MM MISC: 0 refills | Status: AC

## 2016-01-20 MED ORDER — BLOOD GLUCOSE MONITOR KIT: PACK | 0 refills | Status: AC

## 2016-01-20 NOTE — Progress Notes (Signed)
2 Days Post-Op  Subjective: Allen Mooney is s/p drainage of right perinephric hematoma/abscess.   He is doing well without complaints.  The drainage is about 7425ml/shift. ROS:  Review of Systems  All other systems reviewed and are negative.   Anti-infectives: Anti-infectives    Start     Dose/Rate Route Frequency Ordered Stop   01/15/16 0745  ceFAZolin (ANCEF) IVPB 2g/100 mL premix     2 g 200 mL/hr over 30 Minutes Intravenous Every 8 hours 01/15/16 0741     01/15/16 0000  cefTRIAXone (ROCEPHIN) 1 g in dextrose 5 % 50 mL IVPB  Status:  Discontinued     1 g 100 mL/hr over 30 Minutes Intravenous Every 24 hours 01/14/16 0316 01/15/16 0741   01/14/16 0000  cefTRIAXone (ROCEPHIN) 1 g in dextrose 5 % 50 mL IVPB     1 g 100 mL/hr over 30 Minutes Intravenous  Once 01/13/16 2357 01/14/16 0120      Current Facility-Administered Medications  Medication Dose Route Frequency Provider Last Rate Last Dose  . ceFAZolin (ANCEF) IVPB 2g/100 mL premix  2 g Intravenous Q8H Clanford Cyndie MullL Johnson, MD   2 g at 01/20/16 0519  . dextrose 50 % solution 25 mL  25 mL Intravenous PRN Clanford Cyndie MullL Johnson, MD      . HYDROcodone-acetaminophen (NORCO/VICODIN) 5-325 MG per tablet 1-2 tablet  1-2 tablet Oral Q4H PRN Leanne ChangKatherine P Schorr, NP   2 tablet at 01/20/16 0527  . insulin aspart (novoLOG) injection 0-15 Units  0-15 Units Subcutaneous TID WC Clanford Cyndie MullL Johnson, MD   3 Units at 01/19/16 1716  . insulin aspart (novoLOG) injection 0-5 Units  0-5 Units Subcutaneous QHS Clanford Cyndie MullL Johnson, MD   3 Units at 01/19/16 2201  . insulin aspart (novoLOG) injection 12 Units  12 Units Subcutaneous TID WC Clanford Cyndie MullL Johnson, MD   12 Units at 01/19/16 1717  . insulin glargine (LANTUS) injection 30 Units  30 Units Subcutaneous BH-q7a Clanford Cyndie MullL Johnson, MD   30 Units at 01/20/16 [phone]  . polyethylene glycol (MIRALAX / GLYCOLAX) packet 34 g  34 g Oral BID Clanford Cyndie MullL Johnson, MD   34 g at 01/19/16 2154  . polyvinyl alcohol (LIQUIFILM TEARS)  1.4 % ophthalmic solution 1 drop  1 drop Both Eyes PRN Clanford Cyndie MullL Johnson, MD   1 drop at 01/16/16 0249  . senna-docusate (Senokot-S) tablet 2 tablet  2 tablet Oral BID Clanford Cyndie MullL Johnson, MD   2 tablet at 01/19/16 2154  . sodium chloride 0.9 % bolus 1,000 mL  1,000 mL Intravenous Once Glynn OctaveStephen Rancour, MD         Objective: Vital signs in last 24 hours: Temp:  [97.8 F (36.6 C)-98.8 F (37.1 C)] 98.8 F (37.1 C) (01/12 0514) Pulse Rate:  [98-108] 103 (01/12 0514) Resp:  [20] 20 (01/12 0514) BP: (129-146)/(80-85) 146/85 (01/12 0514) SpO2:  [98 %] 98 % (01/12 0514)  Intake/Output from previous day: 01/11 0701 - 01/12 0700 In: 1085 [P.O.:1080] Out: 50 [Drains:50] Intake/Output this shift: No intake/output data recorded.   Physical Exam  Lab Results:   Recent Labs  01/18/16 0557 01/19/16 0648  WBC 9.5 8.3  HGB 9.9* 10.9*  HCT 31.8* 33.6*  PLT 302 370   BMET  Recent Labs  01/18/16 0557 01/19/16 0648  NA 135 134*  K 4.5 4.2  CL 101 101  CO2 28 28  GLUCOSE 217* 238*  BUN 13 14  CREATININE 0.68 0.70  CALCIUM 7.6* 8.0*  PT/INR No results for input(s): LABPROT, INR in the last 72 hours. ABG No results for input(s): PHART, HCO3 in the last 72 hours.  Invalid input(s): PCO2, PO2  Studies/Results: Ct Image Guided Drainage By Percutaneous Catheter  Result Date: 01/18/2016 INDICATION: RIGHT PERINEPHRIC ABSCESS EXAM: CT GUIDED DRAINAGE OF RIGHT PERINEPHRIC ABSCESS MEDICATIONS: The patient is currently admitted to the hospital and receiving intravenous antibiotics. The antibiotics were administered within an appropriate time frame prior to the initiation of the procedure. ANESTHESIA/SEDATION: 2.0 mg IV Versed 100 mcg IV Fentanyl Moderate Sedation Time:  15 MINUTES The patient was continuously monitored during the procedure by the interventional radiology nurse under my direct supervision. COMPLICATIONS: None immediate. TECHNIQUE: Informed written consent was obtained  from the patient after a thorough discussion of the procedural risks, benefits and alternatives. All questions were addressed. Maximal Sterile Barrier Technique was utilized including caps, mask, sterile gowns, sterile gloves, sterile drape, hand hygiene and skin antiseptic. A timeout was performed prior to the initiation of the procedure. PROCEDURE: previous imaging reviewed. patient position left side down decubitus. noncontrast localization ct performed. the right perinephric abscess was localized. overlying skin marked. under sterile conditions and local anesthesia, an 18 gauge 10 cm access needle was advanced percutaneously into the abscess. there was return of purulent fluid. sample sent for gram stain and culture. amplatz guidewire inserted followed by dilatation to advance a 10 french drain. catheter position confirmed with ct. syringe aspiration yielded 290 cc purulent fluid. catheter secured with a prolene suture and connected to external suction bulb. sterile dressing applied. no immediate complication. patient tolerated the procedure well. FINDINGS: imaging confirms percutaneous needle access of the right perinephric abscess for drain insertion IMPRESSION: Successful CT-guided right perinephric abscess drain insertion. Electronically Signed   By: Judie Petit.  Shick M.D.   On: 01/18/2016 12:25   Labs and CT reviewed.  Assessment and Plan: 1. Right  Perinephric hematoma/abscess now doing well post drainage.   Rescan when drainage abates per ID recommendations.       LOS: 6 days    [name] 01/20/[phone] ID: Allen Mooney, male   DOB: 15-Dec-1970, 46 y.o.   MRN: [id number]

## 2016-01-20 NOTE — Progress Notes (Signed)
IV Removed, WNL. Pt given D/C instructions and prescriptions. Verbalized understanding. Pt awaiting ride home.

## 2016-01-20 NOTE — Progress Notes (Signed)
Vascular Access consent signed and placed in patient's chart.

## 2016-01-20 NOTE — Discharge Summary (Signed)
Physician Discharge Summary  Allen Mooney ZOX:[id number] DOB: 1970/05/01 DOA: 01/13/2016  PCP: Allen Deiters, MD  Admit date: 01/13/2016 Discharge date: 01/20/2016  Admitted From: home Disposition:  home  Recommendations for Outpatient Follow-up:  1. Follow up with PCP in 1-2 weeks 2. Please obtain BMP/CBC in one week 3. Patient will be discharged with IV antibiotics until 1/29 4. He will follow up with interventional radiology to reassess drain 5. Follow up with urology in 2 weeks  Home Health: [facility] Equipment/Devices: CT guided drain  Discharge Condition: stable CODE STATUS: full code Diet recommendation: Heart Healthy / Carb Modified    Brief/Interim Summary: Allen Doom Coxis an 46 y.o.malewith hx of DM, HTN, known nephrolithiasis, recent Dx of perinephric hematoma, presented to the ER complaining of feeling weak, increased thrist and polyuria. He has no fever, chills, nausea or vomiting. He was evaluated and found to have a BS of 800's with concomitant pseudohyponatremia of 124, Hb of 11 g per dL, and an abdominal pelvic CT showed increasing size of the pernephric hematoma with resulting shift of the right kidney. He also found to have a small kidney stone causing mild left hydronephrosis. He was given IVF and SQ insulin, and his CBG did drop to 400's, prior to rising to 500's. His UA was positive and he was started on IV Rocephin. EDP spoke with urologist and hospitalist was asked to admit him for hyperosmolar syndrome, along with enlarging perinephric hematoma, and UTI with nephrolithiasis and mild hydronephrosis.   Discharge Diagnoses:  Principal Problem:   Staphylococcus aureus bacteremia Active Problems:   Hyperglycemia   Hyperosmolar syndrome   Hydronephrosis   Perinephric abscess   UTI (urinary tract infection)   Tobacco abuse   Diabetes mellitus (HCC)   Hypertension   Atherosclerotic peripheral vascular disease (HCC)   Normocytic anemia   History of  nephrolithiasis  1. Uncontrolled diabetes mellitus insulin requiring - Patient was admitted to the hospital with polyuria, polydipsia and blood sugars in the 800s. He was treated with IV insulin and subsequently transitioned to subcutaneous insulin. Blood sugars have since been stable.  Also patient was highly glucose toxic on admission with a1c>15.   Diabetes coordinator consulted for education.  Pt was having some vision changes related to tighter glycemic control and he will need to see an opthalmologist as soon as he is discharged for a dilated eye exam.  I am afraid that he may have diabetic retinopathy and has had some bleeding in the left eye.  He may need laser eye treatment to the retinas. This was discussed by Dr. Laural Mooney at bedside.  2. Hyperosmolar nonketotic syndrome - Pt was sent to SDU on 1/6 and started on insulin drip, blood sugars better controlled now and started on lantus and novolog.      3. Large perinephric hematoma/abscess - Found on CT scan on admission. Treated with IV antibiotics. Seen by urology and underwent CT-guided drain placement by interventional radiology. He will follow-up with interventional radiology in the next 1-2 weeks. Follow up urology in 2 weeks. He will need repeat CT imaging once drainage output has diminished.      4. Staph aureus bacteremia -  2/2 BC found to be positive, same bug that was present in urine culture from September, ID followed the patient. He was treated with cefazolin 2 gm IV every 8 h,.  Repeat blood cultures from 1/8 showed no growth.  TEE done by cardiology did not show vegetations. He will be discharged on  ancef until 1/29 5. Chronic constipation - exacerbated by opioids, resolved with laxatives.  6. UTI - presumed staph aureus - treating as above.   Discharge Instructions  Discharge Instructions    Ambulatory referral to Nutrition and Diabetic Education    Complete by:  As directed    For [facility].Patient lives in [place].   Diet - low sodium heart healthy    Complete by:  As directed    Increase activity slowly    Complete by:  As directed      Allergies as of 01/20/2016   No Known Allergies     Medication List    TAKE these medications   ceFAZolin 2-4 GM/100ML-% IVPB Commonly known as:  ANCEF Inject 100 mLs (2 g total) into the vein every 8 (eight) hours. Until 02/06/16   insulin aspart 100 UNIT/ML FlexPen Commonly known as:  NOVOLOG Inject 12 Units into the skin 3 (three) times daily with meals.   insulin glargine 100 unit/mL Sopn Commonly known as:  LANTUS Inject 0.3 mLs (30 Units total) into the skin daily.   metFORMIN 500 MG tablet Commonly known as:  GLUCOPHAGE Take 500 mg by mouth 3 (three) times daily.      Follow-up Information    Allen Dance, MD Follow up.   Specialty:  Interventional Radiology Why:  pt will hear from IR drain clinic for follow up time and date; call [phone] if questions or concerns  do not get drain insertion site wet empty drain daily and track output on a log Contact information: [address] [place] [address] [phone]          No Known Allergies  Consultations:  Urology  Interventional radiology  Infectious disease   Procedures/Studies: Dg Chest 2 View  Result Date: 01/14/2016 CLINICAL DATA:  Visual changes low energy and leg swelling EXAM: CHEST  2 VIEW COMPARISON:  11/23/2014 FINDINGS: Lungs are mildly hyperinflated. Suspect right greater than left apical bulla. No acute infiltrate no consolidation or effusion. Normal cardiomediastinal silhouette. No pneumothorax. IMPRESSION: No radiographic evidence for acute cardiopulmonary abnormality Electronically Signed   By: Allen Mooney M.D.   On: 01/14/2016 00:25   Ct Renal Stone Study  Result Date: 01/14/2016 CLINICAL DATA:  Weakness nausea and lower extremity swelling EXAM: CT ABDOMEN AND PELVIS WITHOUT CONTRAST TECHNIQUE: Multidetector CT imaging of the  abdomen and pelvis was performed following the standard protocol without IV contrast. COMPARISON:  12/20/2015, 09/14/2015 FINDINGS: Lower chest: Small foci of ground-glass density in the right lower lobe suggests mild inflammation or infection. No consolidation or effusion. Normal heart size. Coronary artery calcifications. Hepatobiliary: No focal hepatic abnormality. No biliary dilatation. No calcified gallstones. Tiny calcification along the surface of the right anterior inferior liver. Pancreas: Unremarkable. No pancreatic ductal dilatation or surrounding inflammatory changes. Spleen: Spleen upper normal in size.  No focal splenic abnormality Adrenals/Urinary Tract: Stable 2.3 cm left adrenal gland adenoma. Stable 3.4 cm right adrenal gland adenoma. Mild left hydronephrosis, slightly increased compared to prior. No stone identified along the course of the left ureter. Punctate nonobstructing stone mid pole left kidney. Interval increase in right perinephric fluid collection/probable hematoma, now large. This measures approximately 9.1 cm transverse by 4.4 cm AP thick by 16.7 cm craniocaudad. Small amount of increased density is present in at the posterior inferior aspect of the collection. The right kidney is displaced anteriorly. Multiple nonobstructing stones within the right kidney. No right hydronephrosis. Mild asymmetric enlargement of the right.  Hematoma is difficult to separate from the ileus psoas muscle on the right, this may be secondary to retroperitoneal hemorrhage or mass effect on the ileus psoas muscle. Stomach/Bowel: The stomach is nonenlarged. No dilated small bowel to suggest obstruction. Extensive stool throughout the colon. No colon wall thickening. Vascular/Lymphatic: Got kicked atherosclerotic vascular calcifications. Scattered subcentimeter mesenteric nodes. Reproductive: Prostate is unremarkable. Other: No free air or free fluid. Musculoskeletal: Stable skeletal structures with bilateral  pars defect at L5. IMPRESSION: 1. Significant interval increase in size of a complex fluid right perinephric fluid collection, suspect for hematoma. This exerts mass effect on the right kidney which is displaced anteriorly. There is mass effect on the right iliopsoas muscle. A small amount of hemorrhage or fluid extends along or is within a portion of the right ileus psoas muscle which appears asymmetric compared to the left side. 2. Punctate stones are present in the right kidney. No hydronephrosis. 3. Mild left hydronephrosis. Punctate stone present in the mid left kidney. No ureteral stone identified on the left. 4. No evidence of a bowel obstruction. Large amount of stool is present throughout the colon 5. Stable bilateral adrenal gland adenomas. Electronically Signed   By: Allen Mooney M.D.   On: 01/14/2016 00:23   Ct Image Guided Drainage By Percutaneous Catheter  Result Date: 01/18/2016 INDICATION: RIGHT PERINEPHRIC ABSCESS EXAM: CT GUIDED DRAINAGE OF RIGHT PERINEPHRIC ABSCESS MEDICATIONS: The patient is currently admitted to the hospital and receiving intravenous antibiotics. The antibiotics were administered within an appropriate time frame prior to the initiation of the procedure. ANESTHESIA/SEDATION: 2.0 mg IV Versed 100 mcg IV Fentanyl Moderate Sedation Time:  15 MINUTES The patient was continuously monitored during the procedure by the interventional radiology nurse under my direct supervision. COMPLICATIONS: None immediate. TECHNIQUE: Informed written consent was obtained from the patient after a thorough discussion of the procedural risks, benefits and alternatives. All questions were addressed. Maximal Sterile Barrier Technique was utilized including caps, mask, sterile gowns, sterile gloves, sterile drape, hand hygiene and skin antiseptic. A timeout was performed prior to the initiation of the procedure. PROCEDURE: previous imaging reviewed. patient position left side down decubitus.  noncontrast localization ct performed. the right perinephric abscess was localized. overlying skin marked. under sterile conditions and local anesthesia, an 18 gauge 10 cm access needle was advanced percutaneously into the abscess. there was return of purulent fluid. sample sent for gram stain and culture. amplatz guidewire inserted followed by dilatation to advance a 10 french drain. catheter position confirmed with ct. syringe aspiration yielded 290 cc purulent fluid. catheter secured with a prolene suture and connected to external suction bulb. sterile dressing applied. no immediate complication. patient tolerated the procedure well. FINDINGS: imaging confirms percutaneous needle access of the right perinephric abscess for drain insertion IMPRESSION: Successful CT-guided right perinephric abscess drain insertion. Electronically Signed   By: Judie Petit.  Shick M.D.   On: 01/18/2016 12:25    CT-guided drain placement for perinephric abscess by interventional radiology on 1/10  Transesophageal echocardiogram 1/10: No evidence of vegetations   Subjective: No abdominal pain, feeling better  Discharge Exam: Vitals:   01/19/16 2200 01/20/16 0514  BP: 129/83 (!) 146/85  Pulse: 98 (!) 103  Resp: 20 20  Temp: 98.5 F (36.9 C) 98.8 F (37.1 C)   Vitals:   01/19/16 0533 01/19/16 1300 01/19/16 2200 01/20/16 0514  BP: 124/82 139/80 129/83 (!) 146/85  Pulse: 95 (!) 108 98 (!) 103  Resp: 18 20 20 20   Temp: 99 F (37.2  C) 97.8 F (36.6 C) 98.5 F (36.9 C) 98.8 F (37.1 C)  TempSrc: Oral Oral Oral Oral  SpO2: 98% 98% 98% 98%  Weight:      Height:        General: Pt is alert, awake, not in acute distress Cardiovascular: RRR, S1/S2 +, no rubs, no gallops Respiratory: CTA bilaterally, no wheezing, no rhonchi Abdominal: Soft, NT, ND, bowel sounds + Extremities: no edema, no cyanosis    The results of significant diagnostics from this hospitalization (including imaging, microbiology, ancillary and  laboratory) are listed below for reference.     Microbiology: Recent Results (from the past 240 hour(s))  Blood culture (routine x 2)     Status: Abnormal   Collection Time: 01/13/16 11:30 PM  Result Value Ref Range Status   Specimen Description LEFT ANTECUBITAL  Final   Special Requests BOTTLES DRAWN AEROBIC AND ANAEROBIC [facility] EACH  Final   Culture  Setup Time   Final    GRAM POSITIVE COCCI Gram Stain Report Called to,Read Back By and Verified With: DAVIS,L. AT 1514 ON 01/14/2016 BY EVA IN BOTH AEROBIC AND ANAEROBIC BOTTLES Performed at [facility] CRITICAL RESULT CALLED TO, READ BACK BY AND VERIFIED WITH: J HERN,RN AT 2053 01/14/16 BY L BENFIELD Performed at [facility]    Culture STAPHYLOCOCCUS AUREUS (A)  Final   Report Status 01/16/2016 FINAL  Final   Organism ID, Bacteria STAPHYLOCOCCUS AUREUS  Final      Susceptibility   Staphylococcus aureus - MIC*    CIPROFLOXACIN <=0.5 SENSITIVE Sensitive     ERYTHROMYCIN 0.5 SENSITIVE Sensitive     GENTAMICIN <=0.5 SENSITIVE Sensitive     OXACILLIN 0.5 SENSITIVE Sensitive     TETRACYCLINE <=1 SENSITIVE Sensitive     VANCOMYCIN <=0.5 SENSITIVE Sensitive     TRIMETH/SULFA <=10 SENSITIVE Sensitive     CLINDAMYCIN <=0.25 SENSITIVE Sensitive     RIFAMPIN <=0.5 SENSITIVE Sensitive     Inducible Clindamycin NEGATIVE Sensitive     * STAPHYLOCOCCUS AUREUS  Blood Culture ID Panel (Reflexed)     Status: Abnormal   Collection Time: 01/13/16 11:30 PM  Result Value Ref Range Status   Enterococcus species NOT DETECTED NOT DETECTED Final   Listeria monocytogenes NOT DETECTED NOT DETECTED Final   Staphylococcus species DETECTED (A) NOT DETECTED Final    Comment: CRITICAL RESULT CALLED TO, READ BACK BY AND VERIFIED WITH: J HERN,RN AT 2053 01/14/16 BY L BENFIELD    Staphylococcus aureus DETECTED (A) NOT DETECTED Final    Comment: CRITICAL RESULT CALLED TO, READ BACK BY AND VERIFIED WITH: J HERN,RN AT 2053 01/14/16 BY L BENFIELD     Methicillin resistance NOT DETECTED NOT DETECTED Final   Streptococcus species NOT DETECTED NOT DETECTED Final   Streptococcus agalactiae NOT DETECTED NOT DETECTED Final   Streptococcus pneumoniae NOT DETECTED NOT DETECTED Final   Streptococcus pyogenes NOT DETECTED NOT DETECTED Final   Acinetobacter baumannii NOT DETECTED NOT DETECTED Final   Enterobacteriaceae species NOT DETECTED NOT DETECTED Final   Enterobacter cloacae complex NOT DETECTED NOT DETECTED Final   Escherichia coli NOT DETECTED NOT DETECTED Final   Klebsiella oxytoca NOT DETECTED NOT DETECTED Final   Klebsiella pneumoniae NOT DETECTED NOT DETECTED Final   Proteus species NOT DETECTED NOT DETECTED Final   Serratia marcescens NOT DETECTED NOT DETECTED Final   Haemophilus influenzae NOT DETECTED NOT DETECTED Final   Neisseria meningitidis NOT DETECTED NOT DETECTED Final   Pseudomonas aeruginosa NOT DETECTED NOT DETECTED Final  Candida albicans NOT DETECTED NOT DETECTED Final   Candida glabrata NOT DETECTED NOT DETECTED Final   Candida krusei NOT DETECTED NOT DETECTED Final   Candida parapsilosis NOT DETECTED NOT DETECTED Final   Candida tropicalis NOT DETECTED NOT DETECTED Final    Comment: Performed at [facility]  Blood culture (routine x 2)     Status: Abnormal   Collection Time: 01/13/16 11:43 PM  Result Value Ref Range Status   Specimen Description BLOOD LEFT ARM  Final   Special Requests BOTTLES DRAWN AEROBIC AND ANAEROBIC 4CC EACH  Final   Culture  Setup Time   Final    GRAM POSITIVE COCCI Gram Stain Report Called to,Read Back By and Verified With: DAVIS,L AT 1602 ON 01/14/2016 BY EVA IN BOTH AEROBIC AND ANAEROBIC BOTTLES Performed at [facility] CRITICAL RESULT CALLED TO, READ BACK BY AND VERIFIED WITH: J HERN,RN AT 2053 01/14/16 BY L BENFIELD    Culture (A)  Final    STAPHYLOCOCCUS AUREUS SUSCEPTIBILITIES PERFORMED ON PREVIOUS CULTURE WITHIN THE LAST 5 DAYS. Performed at [facility]     Report Status 01/16/2016 FINAL  Final  MRSA PCR Screening     Status: None   Collection Time: 01/14/16  6:08 PM  Result Value Ref Range Status   MRSA by PCR NEGATIVE NEGATIVE Final    Comment:        The GeneXpert MRSA Assay (FDA approved for NASAL specimens only), is one component of a comprehensive MRSA colonization surveillance program. It is not intended to diagnose MRSA infection nor to guide or monitor treatment for MRSA infections.   Culture, blood (Routine X 2) w Reflex to ID Panel     Status: None (Preliminary result)   Collection Time: 01/16/16 10:48 AM  Result Value Ref Range Status   Specimen Description BLOOD LEFT ARM  Final   Special Requests BOTTLES DRAWN AEROBIC AND ANAEROBIC 8CC  Final   Culture NO GROWTH 3 DAYS  Final   Report Status PENDING  Incomplete  Culture, blood (Routine X 2) w Reflex to ID Panel     Status: None (Preliminary result)   Collection Time: 01/16/16 10:56 AM  Result Value Ref Range Status   Specimen Description BLOOD LEFT HAND  Final   Special Requests BOTTLES DRAWN AEROBIC AND ANAEROBIC 6CC  Final   Culture NO GROWTH 3 DAYS  Final   Report Status PENDING  Incomplete  Aerobic/Anaerobic Culture (surgical/deep wound)     Status: None (Preliminary result)   Collection Time: 01/18/16 11:25 AM  Result Value Ref Range Status   Specimen Description ABSCESS  Final   Special Requests PERINEPHRIC ABSCESS DRAIN  Final   Gram Stain   Final    ABUNDANT WBC PRESENT, PREDOMINANTLY MONONUCLEAR ABUNDANT GRAM POSITIVE COCCI IN CLUSTERS Performed at [facility]    Culture   Final    ABUNDANT STAPHYLOCOCCUS AUREUS NO ANAEROBES ISOLATED; CULTURE IN PROGRESS FOR 5 DAYS    Report Status PENDING  Incomplete   Organism ID, Bacteria STAPHYLOCOCCUS AUREUS  Final      Susceptibility   Staphylococcus aureus - MIC*    CIPROFLOXACIN <=0.5 SENSITIVE Sensitive     ERYTHROMYCIN 0.5 SENSITIVE Sensitive     GENTAMICIN <=0.5 SENSITIVE Sensitive      OXACILLIN 0.5 SENSITIVE Sensitive     TETRACYCLINE <=1 SENSITIVE Sensitive     VANCOMYCIN <=0.5 SENSITIVE Sensitive     TRIMETH/SULFA <=10 SENSITIVE Sensitive     CLINDAMYCIN <=0.25 SENSITIVE Sensitive  RIFAMPIN <=0.5 SENSITIVE Sensitive     Inducible Clindamycin NEGATIVE Sensitive     * ABUNDANT STAPHYLOCOCCUS AUREUS     Labs: BNP (last 3 results) No results for input(s): BNP in the last 8760 hours. Basic Metabolic Panel:  Recent Labs Lab 01/15/16 0446 01/16/16 0504 01/17/16 0608 01/18/16 0557 01/19/16 0648  NA 135 135 134* 135 134*  K 3.2* 4.1 4.4 4.5 4.2  CL 104 105 101 101 101  CO2 27 26 29 28 28   GLUCOSE 62* 298* 396* 217* 238*  BUN 11 12 13 13 14   CREATININE 0.67 0.74 0.80 0.68 0.70  CALCIUM 7.1* 7.2* 7.7* 7.6* 8.0*  MG 1.6* 1.9 1.9 1.8  --    Liver Function Tests: No results for input(s): AST, ALT, ALKPHOS, BILITOT, PROT, ALBUMIN in the last 168 hours. No results for input(s): LIPASE, AMYLASE in the last 168 hours. No results for input(s): AMMONIA in the last 168 hours. CBC:  Recent Labs Lab 01/15/16 0446 01/16/16 0504 01/17/16 0608 01/18/16 0557 01/19/16 0648  WBC 8.9 7.9 8.4 9.5 8.3  HGB 10.3* 9.9* 10.9* 9.9* 10.9*  HCT 31.0* 31.4* 32.7* 31.8* 33.6*  MCV 93.4 94.6 93.4 94.4 93.9  PLT 242 227 274 302 370   Cardiac Enzymes: No results for input(s): CKTOTAL, CKMB, CKMBINDEX, TROPONINI in the last 168 hours. BNP: Invalid input(s): POCBNP CBG:  Recent Labs Lab 01/19/16 1620 01/19/16 2115 01/20/16 0738 01/20/16 1115 01/20/16 1603  GLUCAP 190* 280* 242* 113* 115*   D-Dimer No results for input(s): DDIMER in the last 72 hours. Hgb A1c No results for input(s): HGBA1C in the last 72 hours. Lipid Profile No results for input(s): CHOL, HDL, LDLCALC, TRIG, CHOLHDL, LDLDIRECT in the last 72 hours. Thyroid function studies No results for input(s): TSH, T4TOTAL, T3FREE, THYROIDAB in the last 72 hours.  Invalid input(s): FREET3 Anemia work up No  results for input(s): VITAMINB12, FOLATE, FERRITIN, TIBC, IRON, RETICCTPCT in the last 72 hours. Urinalysis    Component Value Date/Time   COLORURINE STRAW (A) 01/13/2016 2245   APPEARANCEUR HAZY (A) 01/13/2016 2245   LABSPEC 1.017 01/13/2016 2245   PHURINE 6.0 01/13/2016 2245   GLUCOSEU >=500 (A) 01/13/2016 2245   HGBUR MODERATE (A) 01/13/2016 2245   BILIRUBINUR NEGATIVE 01/13/2016 2245   KETONESUR NEGATIVE 01/13/2016 2245   PROTEINUR 100 (A) 01/13/2016 2245   NITRITE NEGATIVE 01/13/2016 2245   LEUKOCYTESUR MODERATE (A) 01/13/2016 2245   Sepsis Labs Invalid input(s): PROCALCITONIN,  WBC,  LACTICIDVEN Microbiology Recent Results (from the past 240 hour(s))  Blood culture (routine x 2)     Status: Abnormal   Collection Time: 01/13/16 11:30 PM  Result Value Ref Range Status   Specimen Description LEFT ANTECUBITAL  Final   Special Requests BOTTLES DRAWN AEROBIC AND ANAEROBIC [facility] EACH  Final   Culture  Setup Time   Final    GRAM POSITIVE COCCI Gram Stain Report Called to,Read Back By and Verified With: DAVIS,L. AT 1514 ON 01/14/2016 BY EVA IN BOTH AEROBIC AND ANAEROBIC BOTTLES Performed at [facility] CRITICAL RESULT CALLED TO, READ BACK BY AND VERIFIED WITH: J HERN,RN AT 2053 01/14/16 BY L BENFIELD Performed at [facility]    Culture STAPHYLOCOCCUS AUREUS (A)  Final   Report Status 01/16/2016 FINAL  Final   Organism ID, Bacteria STAPHYLOCOCCUS AUREUS  Final      Susceptibility   Staphylococcus aureus - MIC*    CIPROFLOXACIN <=0.5 SENSITIVE Sensitive     ERYTHROMYCIN 0.5 SENSITIVE Sensitive  GENTAMICIN <=0.5 SENSITIVE Sensitive     OXACILLIN 0.5 SENSITIVE Sensitive     TETRACYCLINE <=1 SENSITIVE Sensitive     VANCOMYCIN <=0.5 SENSITIVE Sensitive     TRIMETH/SULFA <=10 SENSITIVE Sensitive     CLINDAMYCIN <=0.25 SENSITIVE Sensitive     RIFAMPIN <=0.5 SENSITIVE Sensitive     Inducible Clindamycin NEGATIVE Sensitive     * STAPHYLOCOCCUS AUREUS  Blood Culture ID  Panel (Reflexed)     Status: Abnormal   Collection Time: 01/13/16 11:30 PM  Result Value Ref Range Status   Enterococcus species NOT DETECTED NOT DETECTED Final   Listeria monocytogenes NOT DETECTED NOT DETECTED Final   Staphylococcus species DETECTED (A) NOT DETECTED Final    Comment: CRITICAL RESULT CALLED TO, READ BACK BY AND VERIFIED WITH: J HERN,RN AT 2053 01/14/16 BY L BENFIELD    Staphylococcus aureus DETECTED (A) NOT DETECTED Final    Comment: CRITICAL RESULT CALLED TO, READ BACK BY AND VERIFIED WITH: J HERN,RN AT 2053 01/14/16 BY L BENFIELD    Methicillin resistance NOT DETECTED NOT DETECTED Final   Streptococcus species NOT DETECTED NOT DETECTED Final   Streptococcus agalactiae NOT DETECTED NOT DETECTED Final   Streptococcus pneumoniae NOT DETECTED NOT DETECTED Final   Streptococcus pyogenes NOT DETECTED NOT DETECTED Final   Acinetobacter baumannii NOT DETECTED NOT DETECTED Final   Enterobacteriaceae species NOT DETECTED NOT DETECTED Final   Enterobacter cloacae complex NOT DETECTED NOT DETECTED Final   Escherichia coli NOT DETECTED NOT DETECTED Final   Klebsiella oxytoca NOT DETECTED NOT DETECTED Final   Klebsiella pneumoniae NOT DETECTED NOT DETECTED Final   Proteus species NOT DETECTED NOT DETECTED Final   Serratia marcescens NOT DETECTED NOT DETECTED Final   Haemophilus influenzae NOT DETECTED NOT DETECTED Final   Neisseria meningitidis NOT DETECTED NOT DETECTED Final   Pseudomonas aeruginosa NOT DETECTED NOT DETECTED Final   Candida albicans NOT DETECTED NOT DETECTED Final   Candida glabrata NOT DETECTED NOT DETECTED Final   Candida krusei NOT DETECTED NOT DETECTED Final   Candida parapsilosis NOT DETECTED NOT DETECTED Final   Candida tropicalis NOT DETECTED NOT DETECTED Final    Comment: Performed at [facility]  Blood culture (routine x 2)     Status: Abnormal   Collection Time: 01/13/16 11:43 PM  Result Value Ref Range Status   Specimen Description BLOOD  LEFT ARM  Final   Special Requests BOTTLES DRAWN AEROBIC AND ANAEROBIC 4CC EACH  Final   Culture  Setup Time   Final    GRAM POSITIVE COCCI Gram Stain Report Called to,Read Back By and Verified With: DAVIS,L AT 1602 ON 01/14/2016 BY EVA IN BOTH AEROBIC AND ANAEROBIC BOTTLES Performed at [facility] CRITICAL RESULT CALLED TO, READ BACK BY AND VERIFIED WITH: J HERN,RN AT 2053 01/14/16 BY L BENFIELD    Culture (A)  Final    STAPHYLOCOCCUS AUREUS SUSCEPTIBILITIES PERFORMED ON PREVIOUS CULTURE WITHIN THE LAST 5 DAYS. Performed at [facility]    Report Status 01/16/2016 FINAL  Final  MRSA PCR Screening     Status: None   Collection Time: 01/14/16  6:08 PM  Result Value Ref Range Status   MRSA by PCR NEGATIVE NEGATIVE Final    Comment:        The GeneXpert MRSA Assay (FDA approved for NASAL specimens only), is one component of a comprehensive MRSA colonization surveillance program. It is not intended to diagnose MRSA infection nor to guide or monitor treatment for MRSA infections.  Culture, blood (Routine X 2) w Reflex to ID Panel     Status: None (Preliminary result)   Collection Time: 01/16/16 10:48 AM  Result Value Ref Range Status   Specimen Description BLOOD LEFT ARM  Final   Special Requests BOTTLES DRAWN AEROBIC AND ANAEROBIC 8CC  Final   Culture NO GROWTH 3 DAYS  Final   Report Status PENDING  Incomplete  Culture, blood (Routine X 2) w Reflex to ID Panel     Status: None (Preliminary result)   Collection Time: 01/16/16 10:56 AM  Result Value Ref Range Status   Specimen Description BLOOD LEFT HAND  Final   Special Requests BOTTLES DRAWN AEROBIC AND ANAEROBIC 6CC  Final   Culture NO GROWTH 3 DAYS  Final   Report Status PENDING  Incomplete  Aerobic/Anaerobic Culture (surgical/deep wound)     Status: None (Preliminary result)   Collection Time: 01/18/16 11:25 AM  Result Value Ref Range Status   Specimen Description ABSCESS  Final   Special Requests  PERINEPHRIC ABSCESS DRAIN  Final   Gram Stain   Final    ABUNDANT WBC PRESENT, PREDOMINANTLY MONONUCLEAR ABUNDANT GRAM POSITIVE COCCI IN CLUSTERS [facility]    Culture   Final    ABUNDANT STAPHYLOCOCCUS AUREUS NO ANAEROBES ISOLATED; CULTURE IN PROGRESS FOR 5 DAYS    Report Status PENDING  Incomplete   Organism ID, Bacteria STAPHYLOCOCCUS AUREUS  Final      Susceptibility   Staphylococcus aureus - MIC*    CIPROFLOXACIN <=0.5 SENSITIVE Sensitive     ERYTHROMYCIN 0.5 SENSITIVE Sensitive     GENTAMICIN <=0.5 SENSITIVE Sensitive     OXACILLIN 0.5 SENSITIVE Sensitive     TETRACYCLINE <=1 SENSITIVE Sensitive     VANCOMYCIN <=0.5 SENSITIVE Sensitive     TRIMETH/SULFA <=10 SENSITIVE Sensitive     CLINDAMYCIN <=0.25 SENSITIVE Sensitive     RIFAMPIN <=0.5 SENSITIVE Sensitive     Inducible Clindamycin NEGATIVE Sensitive     * ABUNDANT STAPHYLOCOCCUS AUREUS     Time coordinating discharge: Over 30 minutes  SIGNED:   Erick BlinksMEMON,Sabrea Sankey, MD  [facility] 01/20/2016, 4:34 PM Pager   If 7PM-7AM, please contact night-coverage [email or web address] Password [email or web address]

## 2016-01-20 NOTE — Progress Notes (Signed)
Inpatient Diabetes Program Recommendations  AACE/ADA: New Consensus Statement on Inpatient Glycemic Control (2015)  Target Ranges:  Prepandial:   less than 140 mg/dL      Peak postprandial:   less than 180 mg/dL (1-2 hours)      Critically ill patients:  140 - 180 mg/dL   Results for Allen Mooney, Allen Mooney (MRN [id number]) as of 01/20/2016 08:15  Ref. Range 01/19/2016 07:24 01/19/2016 11:20 01/19/2016 16:20 01/19/2016 21:15 01/20/2016 07:38  Glucose-Capillary Latest Ref Range: 65 - 99 mg/dL 409241 (H)  Novolog 17 units (12 MC + 5 SS) 153 (H)  Novolog 15 units (12 MC + 3 SS) 190 (H)  Novolog 15 units (12 MC + 5 SS) 280 (H)  Novolog 3 units  242 (H)   Review of Glycemic Control  Current orders for Inpatient glycemic control:  Lantus 30 units QAM, Novolog 12 units TID with meals for meal coverage, Novolog 0-15 units TID with meals, Novolog 0-5 units QHS  Inpatient Diabetes Program Recommendations:  Insulin - Meal Coverage: Please consider increasing meal coverage to 15 units TID with meals for meal coverage.  Thanks, Orlando PennerMarie Rowland Ericsson, RN, MSN, CDE Diabetes Coordinator Inpatient Diabetes Program [phone] (Team Pager from 8am to 5pm)

## 2016-01-20 NOTE — Progress Notes (Signed)
[facility] called about putting PICC line in as order was placed yesterday. Awaiting a call back.

## 2016-01-20 NOTE — Care Management (Signed)
Benefits check results:   1. LANTUS 30 UNITS QD  COVER- YES  CO-PAY- $ 20.00  PRIOR APPROVAL- NO   2 NOVOLOG 12 UNITS TID  COVER- YES  CO-PAY- $ 10.00  PRIOR APPROVAL- NO   PHARMACY : PREFER - [facility]   PLEASE LET PATIENT KNOW HE HAS RX COVERAGE WITH [facility]

## 2016-01-21 DIAGNOSIS — R7881 Bacteremia: Secondary | ICD-10-CM | POA: Diagnosis not present

## 2016-01-21 DIAGNOSIS — Z48816 Encounter for surgical aftercare following surgery on the genitourinary system: Secondary | ICD-10-CM | POA: Diagnosis not present

## 2016-01-21 DIAGNOSIS — D649 Anemia, unspecified: Secondary | ICD-10-CM | POA: Diagnosis not present

## 2016-01-21 DIAGNOSIS — Z720 Tobacco use: Secondary | ICD-10-CM | POA: Diagnosis not present

## 2016-01-22 LAB — CULTURE, BLOOD (ROUTINE X 2)
CULTURE: NO GROWTH
Culture: NO GROWTH

## 2016-01-23 ENCOUNTER — Other Ambulatory Visit: Payer: Self-pay | Admitting: Urology

## 2016-01-23 DIAGNOSIS — N151 Renal and perinephric abscess: Secondary | ICD-10-CM

## 2016-01-23 DIAGNOSIS — Z48816 Encounter for surgical aftercare following surgery on the genitourinary system: Secondary | ICD-10-CM | POA: Diagnosis not present

## 2016-01-23 DIAGNOSIS — Z720 Tobacco use: Secondary | ICD-10-CM | POA: Diagnosis not present

## 2016-01-23 DIAGNOSIS — D649 Anemia, unspecified: Secondary | ICD-10-CM | POA: Diagnosis not present

## 2016-01-24 DIAGNOSIS — Z5181 Encounter for therapeutic drug level monitoring: Secondary | ICD-10-CM | POA: Diagnosis not present

## 2016-01-24 DIAGNOSIS — E118 Type 2 diabetes mellitus with unspecified complications: Secondary | ICD-10-CM | POA: Diagnosis not present

## 2016-01-24 DIAGNOSIS — Z79899 Other long term (current) drug therapy: Secondary | ICD-10-CM | POA: Diagnosis not present

## 2016-01-24 LAB — AEROBIC/ANAEROBIC CULTURE W GRAM STAIN (SURGICAL/DEEP WOUND)

## 2016-01-24 LAB — AEROBIC/ANAEROBIC CULTURE (SURGICAL/DEEP WOUND)

## 2016-01-26 ENCOUNTER — Other Ambulatory Visit: Payer: Self-pay

## 2016-01-29 DIAGNOSIS — D649 Anemia, unspecified: Secondary | ICD-10-CM | POA: Diagnosis not present

## 2016-01-29 DIAGNOSIS — Z720 Tobacco use: Secondary | ICD-10-CM | POA: Diagnosis not present

## 2016-01-29 DIAGNOSIS — Z48816 Encounter for surgical aftercare following surgery on the genitourinary system: Secondary | ICD-10-CM | POA: Diagnosis not present

## 2016-01-29 DIAGNOSIS — R7881 Bacteremia: Secondary | ICD-10-CM | POA: Diagnosis not present

## 2016-01-30 DIAGNOSIS — Z5181 Encounter for therapeutic drug level monitoring: Secondary | ICD-10-CM | POA: Diagnosis not present

## 2016-01-30 DIAGNOSIS — Z79899 Other long term (current) drug therapy: Secondary | ICD-10-CM | POA: Diagnosis not present

## 2016-01-30 DIAGNOSIS — N151 Renal and perinephric abscess: Secondary | ICD-10-CM | POA: Diagnosis not present

## 2016-01-30 DIAGNOSIS — A4902 Methicillin resistant Staphylococcus aureus infection, unspecified site: Secondary | ICD-10-CM | POA: Diagnosis not present

## 2016-02-01 ENCOUNTER — Inpatient Hospital Stay: Admission: RE | Admit: 2016-02-01 | Payer: Self-pay | Source: Ambulatory Visit

## 2016-02-01 ENCOUNTER — Other Ambulatory Visit: Payer: Self-pay

## 2016-02-06 DIAGNOSIS — A4902 Methicillin resistant Staphylococcus aureus infection, unspecified site: Secondary | ICD-10-CM | POA: Diagnosis not present

## 2016-02-06 DIAGNOSIS — D649 Anemia, unspecified: Secondary | ICD-10-CM | POA: Diagnosis not present

## 2016-02-06 DIAGNOSIS — Z48816 Encounter for surgical aftercare following surgery on the genitourinary system: Secondary | ICD-10-CM | POA: Diagnosis not present

## 2016-02-06 DIAGNOSIS — Z720 Tobacco use: Secondary | ICD-10-CM | POA: Diagnosis not present

## 2016-02-09 ENCOUNTER — Other Ambulatory Visit: Payer: Self-pay

## 2016-02-16 ENCOUNTER — Inpatient Hospital Stay: Admission: RE | Admit: 2016-02-16 | Payer: Self-pay | Source: Ambulatory Visit

## 2016-02-16 ENCOUNTER — Other Ambulatory Visit: Payer: Self-pay

## 2016-02-17 DIAGNOSIS — E784 Other hyperlipidemia: Secondary | ICD-10-CM | POA: Diagnosis not present

## 2016-02-17 DIAGNOSIS — E1143 Type 2 diabetes mellitus with diabetic autonomic (poly)neuropathy: Secondary | ICD-10-CM | POA: Diagnosis not present

## 2016-02-17 DIAGNOSIS — I10 Essential (primary) hypertension: Secondary | ICD-10-CM | POA: Diagnosis not present

## 2016-02-17 DIAGNOSIS — Z6820 Body mass index (BMI) 20.0-20.9, adult: Secondary | ICD-10-CM | POA: Diagnosis not present

## 2016-02-26 DIAGNOSIS — F17200 Nicotine dependence, unspecified, uncomplicated: Secondary | ICD-10-CM | POA: Diagnosis not present

## 2016-02-26 DIAGNOSIS — Z452 Encounter for adjustment and management of vascular access device: Secondary | ICD-10-CM | POA: Diagnosis not present

## 2016-02-26 DIAGNOSIS — E119 Type 2 diabetes mellitus without complications: Secondary | ICD-10-CM | POA: Diagnosis not present

## 2016-02-26 DIAGNOSIS — Z711 Person with feared health complaint in whom no diagnosis is made: Secondary | ICD-10-CM | POA: Diagnosis not present

## 2016-02-26 DIAGNOSIS — Z7984 Long term (current) use of oral hypoglycemic drugs: Secondary | ICD-10-CM | POA: Diagnosis not present

## 2016-03-11 ENCOUNTER — Encounter (HOSPITAL_COMMUNITY): Payer: Self-pay | Admitting: Emergency Medicine

## 2016-03-11 ENCOUNTER — Emergency Department (HOSPITAL_COMMUNITY): Payer: BLUE CROSS/BLUE SHIELD

## 2016-03-11 ENCOUNTER — Emergency Department (HOSPITAL_COMMUNITY)
Admission: EM | Admit: 2016-03-11 | Discharge: 2016-03-11 | Disposition: A | Discharge status: Home / Self Care | Payer: BLUE CROSS/BLUE SHIELD | Attending: Emergency Medicine | Admitting: Emergency Medicine

## 2016-03-11 DIAGNOSIS — I700 Atherosclerosis of aorta: Secondary | ICD-10-CM | POA: Diagnosis not present

## 2016-03-11 DIAGNOSIS — Z794 Long term (current) use of insulin: Secondary | ICD-10-CM | POA: Insufficient documentation

## 2016-03-11 DIAGNOSIS — N151 Renal and perinephric abscess: Secondary | ICD-10-CM | POA: Diagnosis not present

## 2016-03-11 DIAGNOSIS — I10 Essential (primary) hypertension: Secondary | ICD-10-CM | POA: Diagnosis not present

## 2016-03-11 DIAGNOSIS — F17210 Nicotine dependence, cigarettes, uncomplicated: Secondary | ICD-10-CM | POA: Insufficient documentation

## 2016-03-11 DIAGNOSIS — E119 Type 2 diabetes mellitus without complications: Secondary | ICD-10-CM | POA: Diagnosis not present

## 2016-03-11 DIAGNOSIS — Z79899 Other long term (current) drug therapy: Secondary | ICD-10-CM | POA: Insufficient documentation

## 2016-03-11 DIAGNOSIS — Z4682 Encounter for fitting and adjustment of non-vascular catheter: Secondary | ICD-10-CM | POA: Diagnosis present

## 2016-03-11 LAB — CBC WITH DIFFERENTIAL/PLATELET
BASOS ABS: 0.1 10*3/uL (ref 0.0–0.1)
Basophils Relative: 1 %
Eosinophils Absolute: 0.4 10*3/uL (ref 0.0–0.7)
Eosinophils Relative: 4 %
HEMATOCRIT: 45.1 % (ref 39.0–52.0)
HEMOGLOBIN: 15.2 g/dL (ref 13.0–17.0)
LYMPHS ABS: 2.3 10*3/uL (ref 0.7–4.0)
LYMPHS PCT: 26 %
MCH: 32.4 pg (ref 26.0–34.0)
MCHC: 33.7 g/dL (ref 30.0–36.0)
MCV: 96.2 fL (ref 78.0–100.0)
Monocytes Absolute: 0.5 10*3/uL (ref 0.1–1.0)
Monocytes Relative: 6 %
NEUTROS ABS: 5.7 10*3/uL (ref 1.7–7.7)
Neutrophils Relative %: 63 %
Platelets: 304 10*3/uL (ref 150–400)
RBC: 4.69 MIL/uL (ref 4.22–5.81)
RDW: 14.2 % (ref 11.5–15.5)
WBC: 9 10*3/uL (ref 4.0–10.5)

## 2016-03-11 LAB — BASIC METABOLIC PANEL
ANION GAP: 3 — AB (ref 5–15)
BUN: 12 mg/dL (ref 6–20)
CHLORIDE: 105 mmol/L (ref 101–111)
CO2: 31 mmol/L (ref 22–32)
Calcium: 8.8 mg/dL — ABNORMAL LOW (ref 8.9–10.3)
Creatinine, Ser: 0.92 mg/dL (ref 0.61–1.24)
GFR calc Af Amer: >60 mL/min (ref >60)
GFR calc non Af Amer: >60 mL/min (ref >60)
GLUCOSE: 226 mg/dL — AB (ref 65–99)
POTASSIUM: 4.5 mmol/L (ref 3.5–5.1)
Sodium: 139 mmol/L (ref 135–145)

## 2016-03-11 LAB — CBG MONITORING, ED: Glucose-Capillary: 226 mg/dL — ABNORMAL HIGH (ref 65–99)

## 2016-03-11 MED ORDER — SULFAMETHOXAZOLE-TRIMETHOPRIM 800-160 MG PO TABS: 1.0000 | ORAL_TABLET | Freq: Two times a day (BID) | ORAL | 0 refills | Status: AC

## 2016-03-11 MED ORDER — IOPAMIDOL (ISOVUE-300) INJECTION 61%
100.0000 mL | Freq: Once | INTRAVENOUS | Status: AC | PRN
  Administered 2016-03-11: 100 mL via INTRAVENOUS

## 2016-03-11 NOTE — ED Triage Notes (Signed)
Pt here to have tube taken out from right kidney.  Pt had it placed 1.5 months ago to remove infection.  Pt has no complaints from tube, was sent here to get it removed.

## 2016-03-11 NOTE — ED Provider Notes (Signed)
[facility] DEPT Provider Note   CSN: [id number] Arrival date & time: 03/11/16  0945   By signing my name below, I, Charolotte Eke, attest that this documentation has been prepared under the direction and in the presence of Noemi Chapel, MD. Electronically Signed: Charolotte Eke, Scribe. 03/11/16. 11:01 AM.    History   Chief Complaint Chief Complaint  Patient presents with  . Tube removal    HPI Allen Mooney is a 46 y.o. male who presents to the Emergency Department for tube removal s/p nurse told him to get tube taken out 6 weeks ago. Tube has been in for 6 weeks. Tube is no longer draining. Admitted early January 2018 for [facility] found to have a right perinephric abscess that required an IR drainage, tube was left in place to a JP drain. He admits to missing follow up appointments and states that his home health nurse told him to get his drain taken out but he has not for unknown reasons. Specifically denies pain fever and has only scant drainage from tube last emptied 2 days ago. ROS otherwise neg. Exam is unremarkable except for tube. No surrounding redness, tenderness, or drainage. Bulb on jp drain is empty. He reports that his sugars have been controled since discharge. Med record reviewed and urology wanted repeat ct before tube was removed.    The history is provided by the patient. No language interpreter was used.    Past Medical History:  Diagnosis Date  . Diabetes mellitus without complication ([place])   . Kidney stones   . Perinephric hematoma     Patient Active Problem List   Diagnosis Date Noted  . Staphylococcus aureus bacteremia 01/15/2016  . Diabetes mellitus ([place]) 01/15/2016  . Hypertension 01/15/2016  . Atherosclerotic peripheral vascular disease ([place]) 01/15/2016  . Normocytic anemia 01/15/2016  . History of nephrolithiasis 01/15/2016  . Hyperglycemia 01/14/2016  . 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 Hydronephrosis 01/14/2016  . Perinephric abscess 01/14/2016  .  UTI (urinary tract infection) 01/14/2016  . Tobacco abuse 01/14/2016    Past Surgical History:  Procedure Laterality Date  . TEE WITHOUT CARDIOVERSION N/A 01/18/2016   Procedure: TRANSESOPHAGEAL ECHOCARDIOGRAM (TEE);  Surgeon: Herminio Commons, MD;  Location: [facility] END[facility];  Service: Cardiovascular;  Laterality: N/A;       Home Medications    Prior to Admission medications   Medication Sig Start Date End Date Taking? Authorizing Provider  blood glucose meter kit and supplies KIT Dispense based on patient and insurance preference. Use up to four times daily as directed. (FOR ICD-9 250.00, 250.01). 01/20/16   Kathie Dike, MD  ceFAZolin (ANCEF) 2-4 GM/100ML-% IVPB Inject 100 mLs (2 g total) into the vein every 8 (eight) hours. Until 02/06/16 01/20/16   Kathie Dike, MD  insulin aspart (NOVOLOG) 100 UNIT/ML FlexPen Inject 12 Units into the skin 3 (three) times daily with meals. 01/20/16   Kathie Dike, MD  insulin glargine (LANTUS) 100 unit/mL SOPN Inject 0.3 mLs (30 Units total) into the skin daily. 01/20/16   Kathie Dike, MD  Insulin Pen Needle (PEN NEEDLES) 29G X 12MM MISC Use with lantus and novolog pens as needed 01/20/16   Kathie Dike, MD  metFORMIN (GLUCOPHAGE) 500 MG tablet Take 500 mg by mouth 3 (three) times daily.     Historical Provider, MD    Family History History reviewed. No pertinent family history.  Social History Social History  Substance Use Topics  . Smoking status: Current Every Day Smoker  Packs/day: 1.00    Types: Cigarettes  . Smokeless tobacco: Never Used  . Alcohol use No     Allergies   Patient has no known allergies.   Review of Systems Review of Systems  Constitutional: Negative for fever.  Gastrointestinal: Negative for abdominal pain.  Musculoskeletal: Negative for myalgias.  All other systems reviewed and are negative.    Physical Exam Updated Vital Signs BP (!) 175/110 (BP Location: Left Arm)   Pulse 85   Temp 97.7 F  (36.5 C) (Oral)   Resp 18   Ht '5\' 11"'$  (1.803 m)   Wt 148 lb (67.1 kg)   SpO2 100%   BMI 20.64 kg/m   Physical Exam  Constitutional: He appears well-developed and well-nourished. No distress.  HENT:  Head: Normocephalic and atraumatic.  Mouth/Throat: Oropharynx is clear and moist. No oropharyngeal exudate.  Eyes: Conjunctivae and EOM are normal. Pupils are equal, round, and reactive to light. Right eye exhibits no discharge. Left eye exhibits no discharge. No scleral icterus.  Neck: Normal range of motion. Neck supple. No JVD present. No thyromegaly present.  Cardiovascular: Normal rate, regular rhythm, normal heart sounds and intact distal pulses.  Exam reveals no gallop and no friction rub.   No murmur heard. Pulmonary/Chest: Effort normal and breath sounds normal. No respiratory distress. He has no wheezes. He has no rales.  Abdominal: Soft. Bowel sounds are normal. He exhibits no distension and no mass. There is no tenderness.  Tube present in the right flank, there is no drainage in the tubing or the JP drain suction bulb  Musculoskeletal: Normal range of motion. He exhibits no edema or tenderness.  Lymphadenopathy:    He has no cervical adenopathy.  Neurological: He is alert. Coordination normal.  Skin: Skin is warm and dry. No rash noted. No erythema.  Psychiatric: He has a normal mood and affect. His behavior is normal.  Nursing note and vitals reviewed.    ED Treatments / Results   DIAGNOSTIC STUDIES: Oxygen Saturation is 100% on room air, normal by my interpretation.    COORDINATION OF CARE: 10:17 AM Discussed treatment plan with pt at bedside and pt agreed to plan, repeat Ct, and tube removal.   Labs (all labs ordered are listed, but only abnormal results are displayed) Labs Reviewed  BASIC METABOLIC PANEL - Abnormal; Notable for the following:       Result Value   Glucose, Bld 226 (*)    Calcium 8.8 (*)    Anion gap 3 (*)    All other components within  normal limits  CBG MONITORING, ED - Abnormal; Notable for the following:    Glucose-Capillary 226 (*)    All other components within normal limits  CBC WITH DIFFERENTIAL/PLATELET    Radiology No results found.  Procedures Procedures (including critical care time)  Medications Ordered in ED Medications - No data to display   Initial Impression / Assessment and Plan / ED Course  I have reviewed the triage vital signs and the nursing notes.  Pertinent labs & imaging results that were available during my care of the patient were reviewed by me and considered in my medical decision making (see chart for details).     Discussed with the urologist was looked at the CT scan and agrees that there is no significant fluid collection left, due to the drainage being virtually nothing the patient is stable to have this drain removed. The suture was cut and I removed the drain, there is  no pain with this. There is a small amount of blood at the site, dressing applied, Bactrim for 3 days at the request of the urologist. Routine outpatient follow-up. Patient expresses understanding to the plan.   Final Clinical Impressions(s) / ED Diagnoses   Final diagnoses:  None    New Prescriptions New Prescriptions   SULFAMETHOXAZOLE-TRIMETHOPRIM (BACTRIM DS,SEPTRA DS) 800-160 MG TABLET    Take 1 tablet by mouth 2 (two) times daily.   I personally performed the services described in this documentation, which was scribed in my presence. The recorded information has been reviewed and is accurate.        Noemi Chapel, MD 03/11/16 1352

## 2016-03-11 NOTE — ED Notes (Signed)
Call to CT, creatinine provided- pt will be taken to CT next

## 2016-03-11 NOTE — ED Notes (Signed)
In CT

## 2016-03-11 NOTE — Discharge Instructions (Signed)

## 2016-08-16 DIAGNOSIS — L03031 Cellulitis of right toe: Secondary | ICD-10-CM | POA: Diagnosis not present

## 2016-08-16 DIAGNOSIS — E11621 Type 2 diabetes mellitus with foot ulcer: Secondary | ICD-10-CM | POA: Diagnosis not present

## 2016-08-16 DIAGNOSIS — L02611 Cutaneous abscess of right foot: Secondary | ICD-10-CM | POA: Diagnosis not present

## 2016-08-16 DIAGNOSIS — L97518 Non-pressure chronic ulcer of other part of right foot with other specified severity: Secondary | ICD-10-CM | POA: Diagnosis not present

## 2016-08-17 DIAGNOSIS — E11621 Type 2 diabetes mellitus with foot ulcer: Secondary | ICD-10-CM | POA: Diagnosis not present

## 2016-08-17 DIAGNOSIS — L97518 Non-pressure chronic ulcer of other part of right foot with other specified severity: Secondary | ICD-10-CM | POA: Diagnosis not present

## 2016-08-17 DIAGNOSIS — L02611 Cutaneous abscess of right foot: Secondary | ICD-10-CM | POA: Diagnosis not present

## 2016-08-17 DIAGNOSIS — L03031 Cellulitis of right toe: Secondary | ICD-10-CM | POA: Diagnosis not present

## 2016-08-18 DIAGNOSIS — E11621 Type 2 diabetes mellitus with foot ulcer: Secondary | ICD-10-CM | POA: Diagnosis not present

## 2016-08-18 DIAGNOSIS — L02611 Cutaneous abscess of right foot: Secondary | ICD-10-CM | POA: Diagnosis not present

## 2016-08-18 DIAGNOSIS — L97518 Non-pressure chronic ulcer of other part of right foot with other specified severity: Secondary | ICD-10-CM | POA: Diagnosis not present

## 2016-08-18 DIAGNOSIS — L03031 Cellulitis of right toe: Secondary | ICD-10-CM | POA: Diagnosis not present

## 2017-03-10 IMAGING — CT CT ABD-PELV W/ CM
2 of 4 series · 15 of 46 positions shown, 17 images · IV contrast (Isovue)
Comparison: Abdomen and pelvis CTs dated 01/18/2016, 01/13/2016 and
12/20/2015.

CLINICAL DATA: Pt here to have tube taken out from right kidney. Pt
had it placed 1.5 months ago to remove infection. Pt has no
complaints from tube, was sent here to get it removed HISTORY OF
KIDNEY STONES, DM, RENAL ABSCESS

EXAM:
CT ABDOMEN AND PELVIS WITH CONTRAST
TECHNIQUE: Multidetector CT imaging of the abdomen and pelvis was performed
using the standard protocol following bolus administration of
intravenous contrast.
CONTRAST:  100mL JBQRAS-HTT IOPAMIDOL (JBQRAS-HTT) INJECTION 61%

[Series 2: axial st · axial · 0.92mm/px · z∈[-152,+293]mm · 12 of 99 slices shown, 14 images]
[im 5/99  soft-tissue]
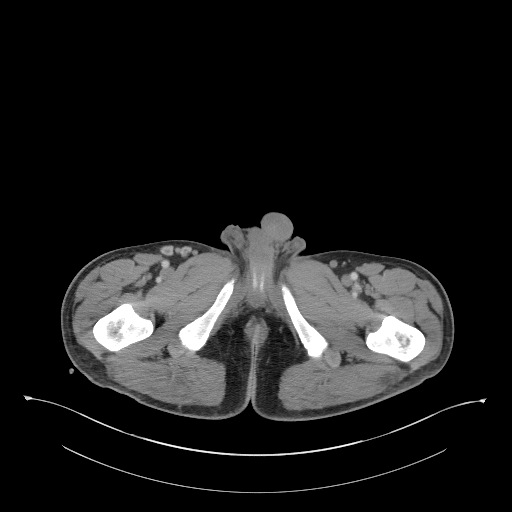
[im 5/99  bone]
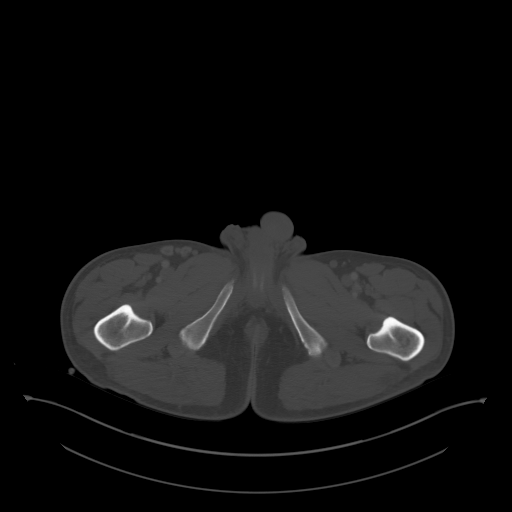
[im 14/99  soft-tissue]
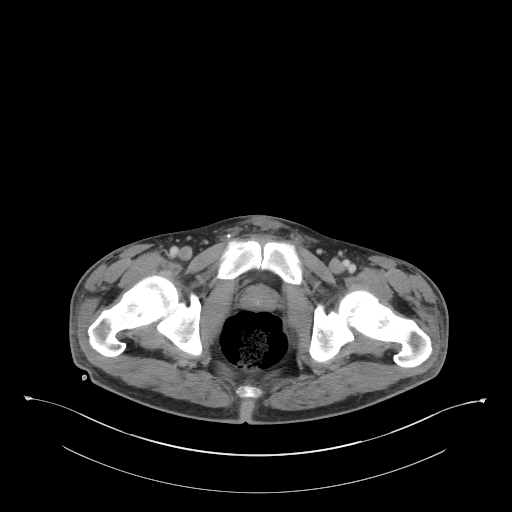
[im 23/99  soft-tissue]
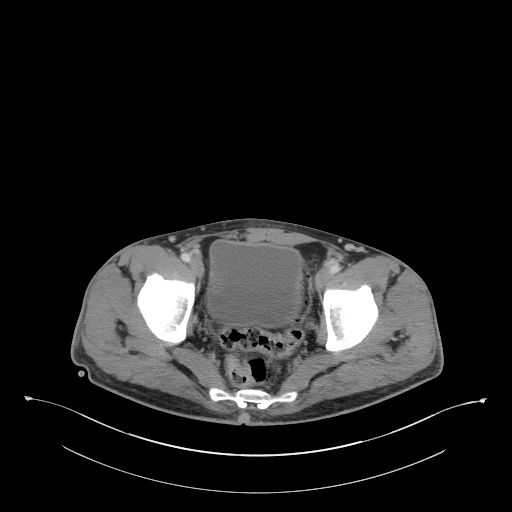
[im 32/99  soft-tissue]
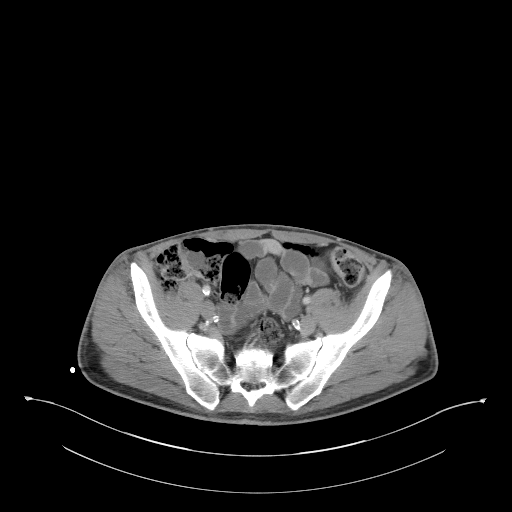
[im 36/99  soft-tissue]
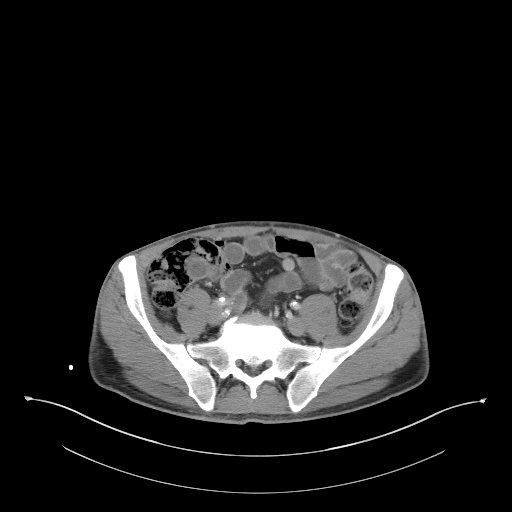
[im 45/99  soft-tissue]
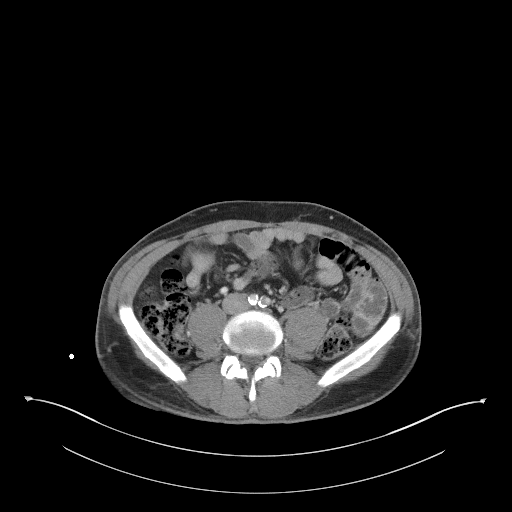
[im 54/99  soft-tissue]
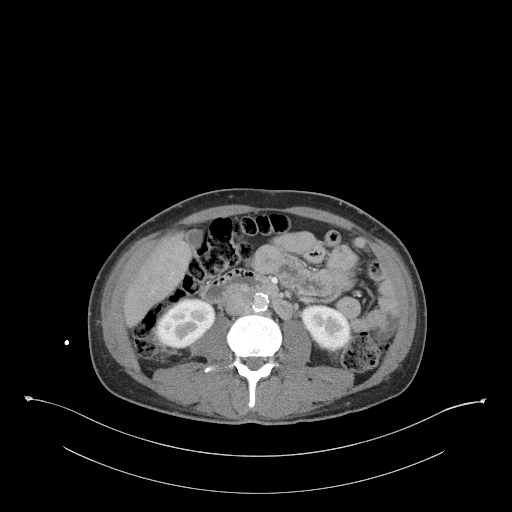
[im 63/99  soft-tissue]
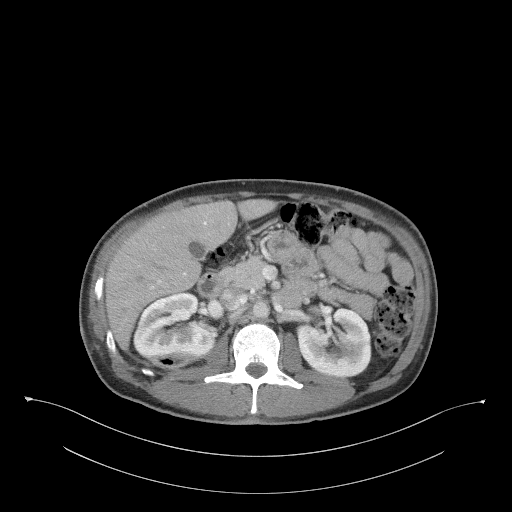
[im 67/99  soft-tissue]
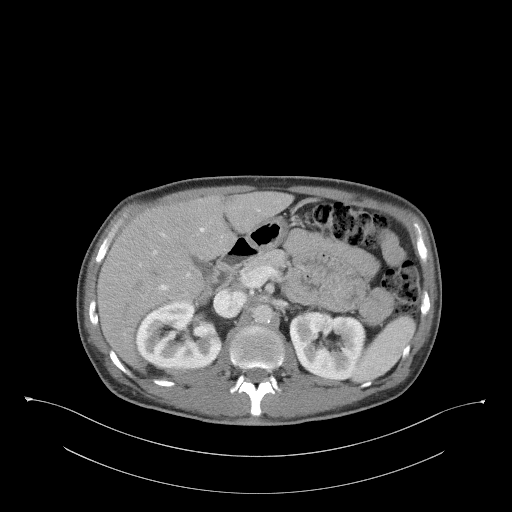
[im 67/99  bone]
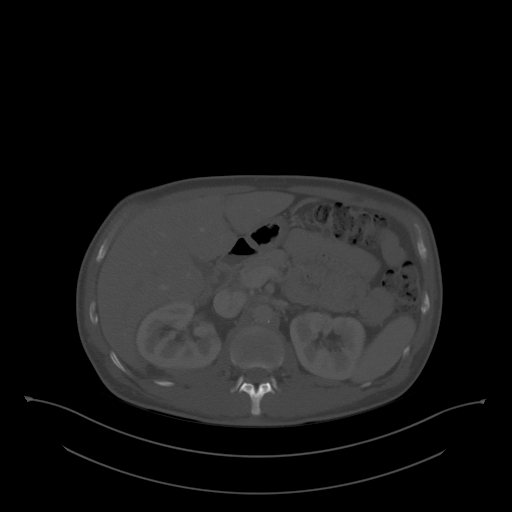
[im 76/99  soft-tissue]
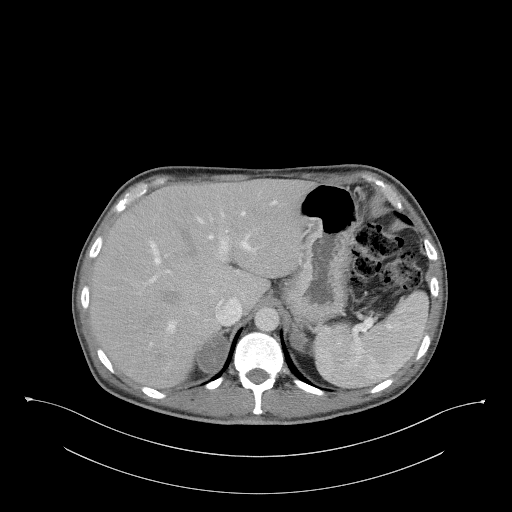
[im 85/99  soft-tissue]
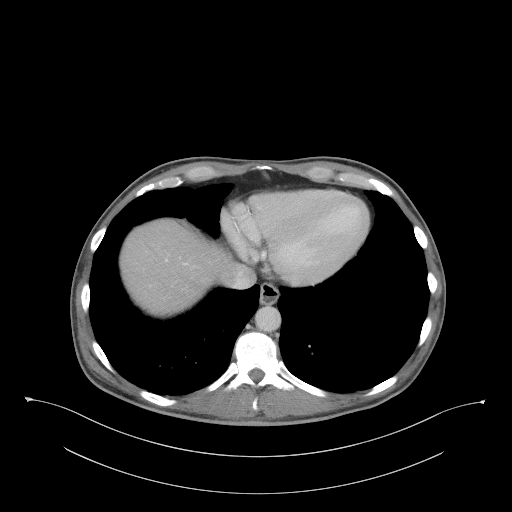
[im 94/99  soft-tissue]
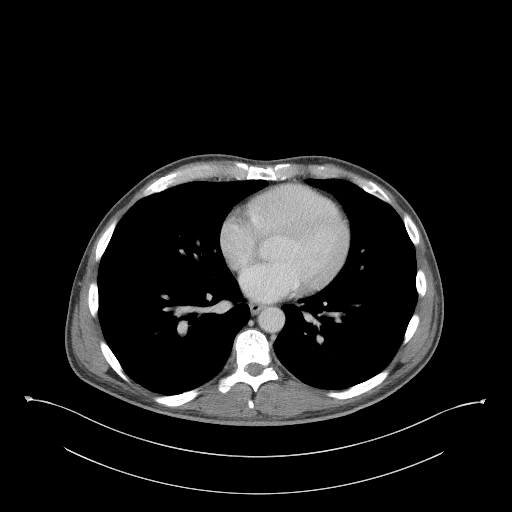

[Series 5: coronal st · coronal · 0.68mm/px · 3 of 97 slices shown]
[im 33/97  soft-tissue]
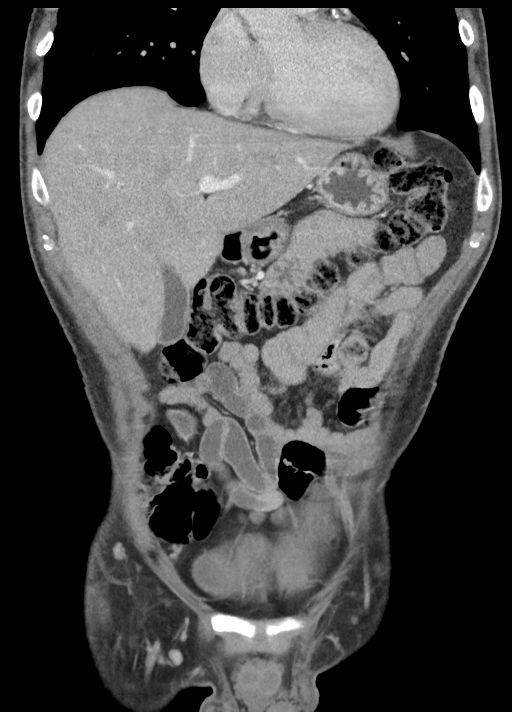
[im 43/97  soft-tissue]
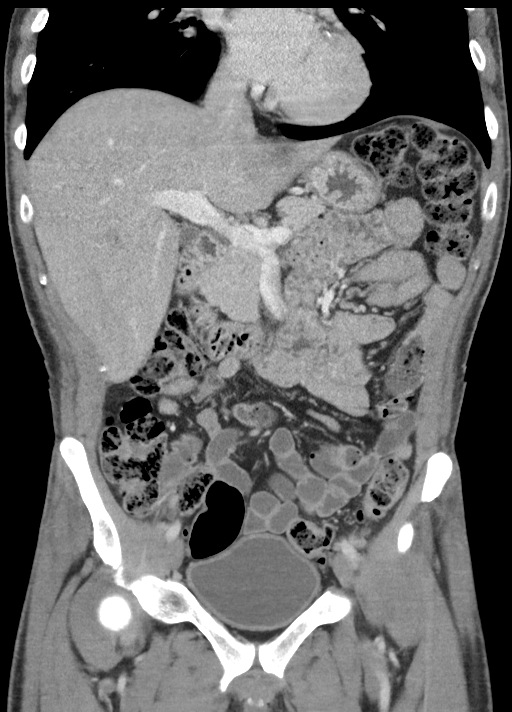
[im 54/97  soft-tissue]
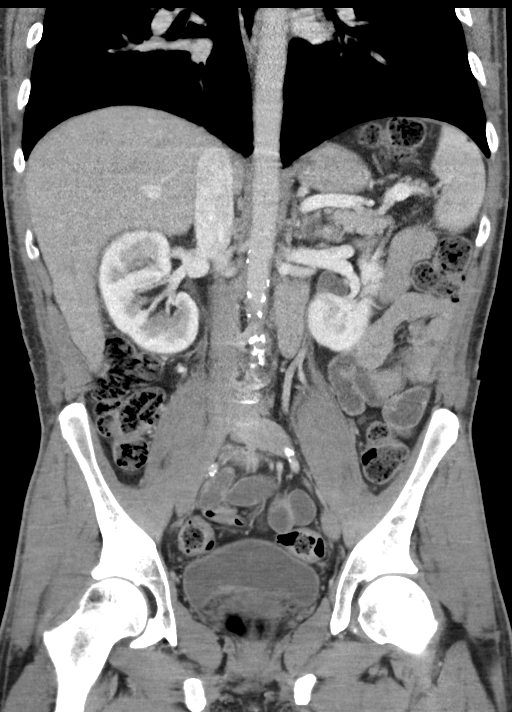

[15 of 46 positions shown; findings below may reference images not displayed]

FINDINGS: Lower chest: No acute abnormality.

Hepatobiliary: No focal liver abnormality is seen. No gallstones,
gallbladder wall thickening, or biliary dilatation.

Pancreas: Unremarkable. No pancreatic ductal dilatation or
surrounding inflammatory changes.

Spleen: Normal in size without focal abnormality.

Adrenals/Urinary Tract: Bilateral adrenal adenomas are stable.

Percutaneous drainage catheter is situated along the posterior
cortex of the right kidney. The previously demonstrated perinephric
abscess is nearly completely resolved. Right kidney is otherwise
unremarkable.

Left kidney appears normal without mass, stone or hydronephrosis. No
ureteral or bladder calculi identified. Bladder is unremarkable.

Stomach/Bowel: Bowel is normal in caliber. No bowel wall thickening
or evidence of bowel wall inflammation seen. There is a fairly large
amount of stool again noted throughout the colon. Appendix appears
normal. Stomach is unremarkable.

Vascular/Lymphatic: Aortic atherosclerosis. No enlarged lymph nodes
seen.

Reproductive: Prostate is unremarkable.

Other: No free fluid. No new abscess collection. No free
intraperitoneal air.

Musculoskeletal: Bilateral chronic pars interarticularis defects at
the L5-S1 level without associated spondylolisthesis. Mild
degenerative change in the lower lumbar spine. No acute or
suspicious osseous finding. Superficial soft tissues are
unremarkable.
IMPRESSION: 1. Near complete resolution of the right perinephric abscess
collection status post earlier percutaneous drainage catheter
placement.
2. Fairly large amount of stool throughout the colon
(constipation?).
3. Aortic atherosclerosis.  The atherosclerosis is advanced for age.
4. Bilateral chronic pars interarticularis defects at L5-S1 level
without associated spondylolisthesis.

## 2018-07-09 DEATH — deceased
